# Patient Record
Sex: Male | Born: 1985 | Race: Black or African American | Hispanic: No | Marital: Married | Smoking: Light tobacco smoker
Health system: Southern US, Community
[De-identification: ages and names within clinical notes are randomized; demographics above are authoritative.]

## PROBLEM LIST (undated history)

## (undated) HISTORY — PX: OTHER SURGICAL HISTORY: SHX169

---

## 2004-08-01 ENCOUNTER — Emergency Department: Payer: Self-pay | Admitting: Internal Medicine

## 2006-08-28 ENCOUNTER — Emergency Department: Payer: Self-pay | Admitting: Emergency Medicine

## 2007-06-08 ENCOUNTER — Emergency Department: Payer: Self-pay | Admitting: Emergency Medicine
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
## 2008-06-14 ENCOUNTER — Ambulatory Visit: Payer: Self-pay | Admitting: Internal Medicine

## 2009-05-18 ENCOUNTER — Ambulatory Visit: Payer: Self-pay | Admitting: Internal Medicine

## 2012-12-25 ENCOUNTER — Emergency Department: Payer: Self-pay | Admitting: Emergency Medicine
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
## 2015-02-25 ENCOUNTER — Ambulatory Visit (INDEPENDENT_AMBULATORY_CARE_PROVIDER_SITE_OTHER): Payer: BLUE CROSS/BLUE SHIELD

## 2015-02-25 ENCOUNTER — Encounter: Payer: Self-pay | Admitting: *Deleted

## 2015-02-25 ENCOUNTER — Ambulatory Visit
Admission: EM | Admit: 2015-02-25 | Discharge: 2015-02-25 | Disposition: A | Discharge status: Home / Self Care | Payer: BLUE CROSS/BLUE SHIELD | Attending: Family Medicine | Admitting: Family Medicine

## 2015-02-25 DIAGNOSIS — S46912A Strain of unspecified muscle, fascia and tendon at shoulder and upper arm level, left arm, initial encounter: Secondary | ICD-10-CM

## 2015-02-25 MED ORDER — IBUPROFEN 800 MG PO TABS: 800.0000 mg | ORAL_TABLET | Freq: Three times a day (TID) | ORAL | Status: DC

## 2015-02-25 MED ORDER — ACETAMINOPHEN 500 MG PO TABS: 1000.0000 mg | ORAL_TABLET | Freq: Four times a day (QID) | ORAL | Status: AC | PRN

## 2015-02-25 NOTE — Discharge Instructions (Signed)
Cryotherapy Cryotherapy means treatment with cold. Ice or gel packs can be used to reduce both pain and swelling. Ice is the most helpful within the first 24 to 48 hours after an injury or flare-up from overusing a muscle or joint. Sprains, strains, spasms, burning pain, shooting pain, and aches can all be eased with ice. Ice can also be used when recovering from surgery. Ice is effective, has very few side effects, and is safe for most people to use. PRECAUTIONS  Ice is not a safe treatment option for people with: 1. Raynaud phenomenon. This is a condition affecting small blood vessels in the extremities. Exposure to cold may cause your problems to return. 2. Cold hypersensitivity. There are many forms of cold hypersensitivity, including: 1. Cold urticaria. Red, itchy hives appear on the skin when the tissues begin to warm after being iced. 2. Cold erythema. This is a red, itchy rash caused by exposure to cold. 3. Cold hemoglobinuria. Red blood cells break down when the tissues begin to warm after being iced. The hemoglobin that carry oxygen are passed into the urine because they cannot combine with blood proteins fast enough. 3. Numbness or altered sensitivity in the area being iced. If you have any of the following conditions, do not use ice until you have discussed cryotherapy with your caregiver: 1. Heart conditions, such as arrhythmia, angina, or chronic heart disease. 2. High blood pressure. 3. Healing wounds or open skin in the area being iced. 4. Current infections. 5. Rheumatoid arthritis. 6. Poor circulation. 7. Diabetes. Ice slows the blood flow in the region it is applied. This is beneficial when trying to stop inflamed tissues from spreading irritating chemicals to surrounding tissues. However, if you expose your skin to cold temperatures for too long or without the proper protection, you can damage your skin or nerves. Watch for signs of skin damage due to cold. HOME CARE  INSTRUCTIONS Follow these tips to use ice and cold packs safely. 1. Place a dry or damp towel between the ice and skin. A damp towel will cool the skin more quickly, so you may need to shorten the time that the ice is used. 2. For a more rapid response, add gentle compression to the ice. 3. Ice for no more than 10 to 20 minutes at a time. The bonier the area you are icing, the less time it will take to get the benefits of ice. 4. Check your skin after 5 minutes to make sure there are no signs of a poor response to cold or skin damage. 5. Rest 20 minutes or more between uses. 6. Once your skin is numb, you can end your treatment. You can test numbness by very lightly touching your skin. The touch should be so light that you do not see the skin dimple from the pressure of your fingertip. When using ice, most people will feel these normal sensations in this order: cold, burning, aching, and numbness. 7. Do not use ice on someone who cannot communicate their responses to pain, such as small children or people with dementia. HOW TO MAKE AN ICE PACK Ice packs are the most common way to use ice therapy. Other methods include ice massage, ice baths, and cryosprays. Muscle creams that cause a cold, tingly feeling do not offer the same benefits that ice offers and should not be used as a substitute unless recommended by your caregiver. To make an ice pack, do one of the following: 1. Place crushed ice or a  bag of frozen vegetables in a sealable plastic bag. Squeeze out the excess air. Place this bag inside another plastic bag. Slide the bag into a pillowcase or place a damp towel between your skin and the bag. 2. Mix 3 parts water with 1 part rubbing alcohol. Freeze the mixture in a sealable plastic bag. When you remove the mixture from the freezer, it will be slushy. Squeeze out the excess air. Place this bag inside another plastic bag. Slide the bag into a pillowcase or place a damp towel between your skin and  the bag. SEEK MEDICAL CARE IF: 1. You develop white spots on your skin. This may give the skin a blotchy (mottled) appearance. 2. Your skin turns blue or pale. 3. Your skin becomes waxy or hard. 4. Your swelling gets worse. MAKE SURE YOU:  1. Understand these instructions. 2. Will watch your condition. 3. Will get help right away if you are not doing well or get worse.   This information is not intended to replace advice given to you by your health care provider. Make sure you discuss any questions you have with your health care provider.   Document Released: 11/25/2010 Document Revised: 04/21/2014 Document Reviewed: 11/25/2010 Elsevier Interactive Patient Education 2016 [employer]  Muscle Strain A muscle strain is an injury that occurs when a muscle is stretched beyond its normal length. Usually a small number of muscle fibers are torn when this happens. Muscle strain is rated in degrees. First-degree strains have the least amount of muscle fiber tearing and pain. Second-degree and third-degree strains have increasingly more tearing and pain.  Usually, recovery from muscle strain takes 1-2 weeks. Complete healing takes 5-6 weeks.  CAUSES  Muscle strain happens when a sudden, violent force placed on a muscle stretches it too far. This may occur with lifting, sports, or a fall.  RISK FACTORS Muscle strain is especially common in athletes.  SIGNS AND SYMPTOMS At the site of the muscle strain, there may be: 4. Pain. 5. Bruising. 6. Swelling. 7. Difficulty using the muscle due to pain or lack of normal function. DIAGNOSIS  Your health care provider will perform a physical exam and ask about your medical history. TREATMENT  Often, the best treatment for a muscle strain is resting, icing, and applying cold compresses to the injured area.  HOME CARE INSTRUCTIONS  8. Use the PRICE method of treatment to promote muscle healing during the first 2-3 days after your injury. The PRICE  method involves:  Protecting the muscle from being injured again.  Restricting your activity and resting the injured body part.  Icing your injury. To do this, put ice in a plastic bag. Place a towel between your skin and the bag. Then, apply the ice and leave it on from 15-20 minutes each hour. After the third day, switch to moist heat packs.  Apply compression to the injured area with a splint or elastic bandage. Be careful not to wrap it too tightly. This may interfere with blood circulation or increase swelling.  Elevate the injured body part above the level of your heart as often as you can. 9. Only take over-the-counter or prescription medicines for pain, discomfort, or fever as directed by your health care provider. 10. Warming up prior to exercise helps to prevent future muscle strains. SEEK MEDICAL CARE IF:  8. You have increasing pain or swelling in the injured area. 9. You have numbness, tingling, or a significant loss of strength in the injured area. MAKE SURE  YOU:  3. Understand these instructions. 4. Will watch your condition. 5. Will get help right away if you are not doing well or get worse.   This information is not intended to replace advice given to you by your health care provider. Make sure you discuss any questions you have with your health care provider.   Document Released: 03/31/2005 Document Revised: 01/19/2013 Document Reviewed: 10/28/2012 Elsevier Interactive Patient Education 2016 [employer] Shoulder Range of Motion Exercises Shoulder range of motion (ROM) exercises are designed to keep the shoulder moving freely. They are often recommended for people who have shoulder pain. MOVEMENT EXERCISE When you are able, do this exercise 5-6 days per week, or as told by your health care provider. Work toward doing 2 sets of 10 swings. Pendulum Exercise How To Do This Exercise Lying Down 8. Lie face-down on a bed with your abdomen close to the side of the  bed. 9. Let your arm hang over the side of the bed. 10. Relax your shoulder, arm, and hand. 11. Slowly and gently swing your arm forward and back. Do not use your neck muscles to swing your arm. They should be relaxed. If you are struggling to swing your arm, have someone gently swing it for you. When you do this exercise for the first time, swing your arm at a 15 degree angle for 15 seconds, or swing your arm 10 times. As pain lessens over time, increase the angle of the swing to 30-45 degrees. 12. Repeat steps 1-4 with the other arm. How To Do This Exercise While Standing 11. Stand next to a sturdy chair or table and hold on to it with your hand.  Bend forward at the waist.  Bend your knees slightly.  Relax your other arm and let it hang limp.  Relax the shoulder blade of the arm that is hanging and let it drop.  While keeping your shoulder relaxed, use body motion to swing your arm in small circles. The first time you do this exercise, swing your arm for about 30 seconds or 10 times. When you do it next time, swing your arm for a little longer.  Stand up tall and relax.  Repeat steps 1-7, this time changing the direction of the circles. 12. Repeat steps 1-8 with the other arm. STRETCHING EXERCISES Do these exercises 3-4 times per day on 5-6 days per week or as told by your health care provider. Work toward holding the stretch for 20 seconds. Stretching Exercise 1 10. Lift your arm straight out in front of you. 11. Bend your arm 90 degrees at the elbow (right angle) so your forearm goes across your body and looks like the letter "L." 12. Use your other arm to gently pull the elbow forward and across your body. 13. Repeat steps 1-3 with the other arm. Stretching Exercise 2 You will need a towel or rope for this exercise. 6. Bend one arm behind your back with the palm facing outward. 7. Hold a towel with your other hand. 8. Reach the arm that holds the towel above your head, and bend  that arm at the elbow. Your wrist should be behind your neck. 9. Use your free hand to grab the free end of the towel. 10. With the higher hand, gently pull the towel up behind you. 11. With the lower hand, pull the towel down behind you. 12. Repeat steps 1-6 with the other arm. STRENGTHENING EXERCISES Do each of these exercises at four different times  of day (sessions) every day or as told by your health care provider. To begin with, repeat each exercise 5 times (repetitions). Work toward doing 3 sets of 12 repetitions or as told by your health care provider. Strengthening Exercise 1 You will need a light weight for this activity. As you grow stronger, you may use a heavier weight. 5. Standing with a weight in your hand, lift your arm straight out to the side until it is at the same height as your shoulder. 6. Bend your arm at 90 degrees so that your fingers are pointing to the ceiling. 7. Slowly raise your hand until your arm is straight up in the air. 8. Repeat steps 1-3 with the other arm. Strengthening Exercise 2 You will need a light weight for this activity. As you grow stronger, you may use a heavier weight. 4. Standing with a weight in your hand, gradually move your straight arm in an arc, starting at your side, then out in front of you, then straight up over your head. 5. Gradually move your other arm in an arc, starting at your side, then out in front of you, then straight up over your head. 6. Repeat steps 1-2 with the other arm. Strengthening Exercise 3 You will need an elastic band for this activity. As you grow stronger, gradually increase the size of the bands or increase the number of bands that you use at one time. 1. While standing, hold an elastic band in one hand and raise that arm up in the air. 2. With your other hand, pull down the band until that hand is by your side. 3. Repeat steps 1-2 with the other arm.   This information is not intended to replace advice given to  you by your health care provider. Make sure you discuss any questions you have with your health care provider.   Document Released: 12/28/2002 Document Revised: 08/15/2014 Document Reviewed: 03/27/2014 Elsevier Interactive Patient Education 2016 [employer] Contusion A contusion is a deep bruise. Contusions are the result of a blunt injury to tissues and muscle fibers under the skin. The injury causes bleeding under the skin. The skin overlying the contusion may turn blue, purple, or yellow. Minor injuries will give you a painless contusion, but more severe contusions may stay painful and swollen for a few weeks.  CAUSES  This condition is usually caused by a blow, trauma, or direct force to an area of the body. SYMPTOMS  Symptoms of this condition include: 13. Swelling of the injured area. 14. Pain and tenderness in the injured area. 15. Discoloration. The area may have redness and then turn blue, purple, or yellow. DIAGNOSIS  This condition is diagnosed based on a physical exam and medical history. An X-ray, CT scan, or MRI may be needed to determine if there are any associated injuries, such as broken bones (fractures). TREATMENT  Specific treatment for this condition depends on what area of the body was injured. In general, the best treatment for a contusion is resting, icing, applying pressure to (compression), and elevating the injured area. This is often called the RICE strategy. Over-the-counter anti-inflammatory medicines may also be recommended for pain control.  HOME CARE INSTRUCTIONS  13. Rest the injured area. 14. If directed, apply ice to the injured area:  Put ice in a plastic bag.  Place a towel between your skin and the bag.  Leave the ice on for 20 minutes, 2-3 times per day. 15. If directed, apply light compression to  the injured area using an elastic bandage. Make sure the bandage is not wrapped too tightly. Remove and reapply the bandage as directed by your health  care provider. 16. If possible, raise (elevate) the injured area above the level of your heart while you are sitting or lying down. 17. Take over-the-counter and prescription medicines only as told by your health care provider. SEEK MEDICAL CARE IF: 14. Your symptoms do not improve after several days of treatment. 15. Your symptoms get worse. 16. You have difficulty moving the injured area. SEEK IMMEDIATE MEDICAL CARE IF:  13. You have severe pain. 14. You have numbness in a hand or foot. 15. Your hand or foot turns pale or cold.   This information is not intended to replace advice given to you by your health care provider. Make sure you discuss any questions you have with your health care provider.   Document Released: 01/08/2005 Document Revised: 12/20/2014 Document Reviewed: 08/16/2014 Elsevier Interactive Patient Education 2016 [employer] Shoulder Sprain A shoulder sprain is a partial or complete tear in one of the tough, fiber-like tissues (ligaments) in the shoulder. The ligaments in the shoulder help to hold the shoulder in place. CAUSES This condition may be caused by: 16. A fall. 17. A hit to the shoulder. 18. A twist of the arm. RISK FACTORS This condition is more likely to develop in: 57. People who play sports. 19. People who have problems with balance or coordination. SYMPTOMS Symptoms of this condition include: 17. Pain when moving the shoulder. 18. Limited ability to move the shoulder. 19. Swelling and tenderness on top of the shoulder. 20. Warmth in the shoulder. 21. A change in the shape of the shoulder. 22. Redness or bruising on the shoulder. DIAGNOSIS This condition is diagnosed with a physical exam. During the exam, you may be asked to do simple exercises with your shoulder. You may also have imaging tests, such as X-rays, MRI, or a CT scan. These tests can show how severe the sprain is. TREATMENT This condition may be treated with: 16. Rest. 17. Pain  medicine. 18. Ice. 19. A sling or brace. This is used to keep the arm still while the shoulder is healing. 20. Physical therapy or rehabilitation exercises. These help to improve the range of motion and strength of the shoulder. 21. Surgery (rare). Surgery may be needed if the sprain caused a joint to become unstable. Surgery may also be needed to reduce pain. Some people may develop ongoing shoulder pain or lose some range of motion in the shoulder. However, most people do not develop long-term problems. HOME CARE INSTRUCTIONS 9. Rest. 10. Take over-the-counter and prescription medicines only as told by your health care provider. 11. If directed, apply ice to the area: 1. Put ice in a plastic bag. 2. Place a towel between your skin and the bag. 3. Leave the ice on for 20 minutes, 2-3 times per day. 12. If you were given a shoulder sling or brace: 1. Wear it as told. 2. Remove it to shower or bathe. 3. Move your arm only as much as told by your health care provider, but keep your hand moving to prevent swelling. 13. If you were shown how to do any exercises, do them as told by your health care provider. 14. Keep all follow-up visits as told by your health care provider. This is important. SEEK MEDICAL CARE IF: 7. Your pain gets worse. 8. Your pain is not relieved with medicines. 9. You have increased  redness or swelling. SEEK IMMEDIATE MEDICAL CARE IF: 4. You have a fever. 5. You cannot move your arm or shoulder. 6. You develop numbness or tingling in your arms, hands, or fingers.   This information is not intended to replace advice given to you by your health care provider. Make sure you discuss any questions you have with your health care provider.   Document Released: 08/17/2008 Document Revised: 12/20/2014 Document Reviewed: 07/24/2014 Else[employer] Interactive Patient Education [employer]

## 2015-02-25 NOTE — ED Provider Notes (Signed)
CSN: [id number]     Arrival date & time 02/25/15  1446 History   First MD Initiated Contact with Patient 02/25/15 1543     Chief Complaint  Patient presents with  . Shoulder Pain   (Consider location/radiation/quality/duration/timing/severity/associated sxs/prior Treatment) HPI Comments: Married african [place] male was playing football today on grass and another player landed on top of him.  Having pain mid trapezius left worsens with movement of left arm more than 1 foot from his side/putting on seat belt.  Applied ice, hasn't taken any medications.  Works as Naval architect for [employer].  Denied previous injury to left shoulder  Right hand dominant.  PSHx: ACL/MCL repair  PMHx denied  NKDA  The history is provided by the patient.    History reviewed. No pertinent past medical history. Past Surgical History  Procedure Laterality Date  . Acl mcl left knee Left    Family History  Problem Relation Age of Onset  . Hypertension Father    Social History  Substance Use Topics  . Smoking status: Never Smoker   . Smokeless tobacco: None  . Alcohol Use: Yes    Review of Systems  Constitutional: Negative for fever and chills.  HENT: Negative for congestion, ear pain and sore throat.   Eyes: Negative for pain and discharge.  Respiratory: Negative for cough and wheezing.   Cardiovascular: Negative for chest pain and leg swelling.  Gastrointestinal: Negative for nausea, vomiting, diarrhea, constipation and blood in stool.  Genitourinary: Negative for dysuria, hematuria and difficulty urinating.  Musculoskeletal: Positive for myalgias. Negative for back pain, joint swelling, arthralgias, gait problem, neck pain and neck stiffness.  Skin: Negative for rash.  Allergic/Immunologic: Negative for environmental allergies and food allergies.  Neurological: Negative for headaches.  Hematological: Negative for adenopathy. Does not bruise/bleed easily.  Psychiatric/Behavioral: Negative for  confusion and agitation. The patient is not nervous/anxious.     Allergies  Review of patient's allergies indicates no known allergies.  Home Medications   Prior to Admission medications   Medication Sig Start Date End Date Taking? Authorizing Provider  acetaminophen (TYLENOL) 500 MG tablet Take 2 tablets (1,000 mg total) by mouth every 6 (six) hours as needed for mild pain or moderate pain. 02/25/15 03/04/15  Barbaraann Barthel, NP  ibuprofen (ADVIL,MOTRIN) 800 MG tablet Take 1 tablet (800 mg total) by mouth 3 (three) times daily. 02/25/15   Barbaraann Barthel, NP   Meds Ordered and Administered this Visit  Medications - No data to display  BP 145/84 mmHg  Pulse 96  Temp(Src) 98.2 F (36.8 C) (Oral)  Resp 20  Ht  (2.032 m)  Wt 350 lb (158.759 kg)  BMI 38.45 kg/m2  SpO2 100% No data found.   Physical Exam  Constitutional: He is oriented to person, place, and time. Vital signs are normal. He appears well-developed and well-nourished. He is active and cooperative.  Non-toxic appearance. He does not have a sickly appearance. He does not appear ill. No distress.  HENT:  Head: Normocephalic and atraumatic.  Right Ear: Hearing, tympanic membrane, external ear and ear canal normal.  Left Ear: Hearing, tympanic membrane, external ear and ear canal normal.  Nose: Nose normal. No mucosal edema, rhinorrhea, nose lacerations, sinus tenderness, nasal deformity or septal deviation. No epistaxis. Right sinus exhibits no maxillary sinus tenderness and no frontal sinus tenderness. Left sinus exhibits no maxillary sinus tenderness and no frontal sinus tenderness.  Mouth/Throat: Uvula is midline, oropharynx is clear and moist and mucous  membranes are normal. Mucous membranes are not pale, not dry and not cyanotic. He does not have dentures. No oral lesions. No trismus in the jaw. Normal dentition. No dental abscesses, uvula swelling, lacerations or dental caries. No oropharyngeal exudate,  posterior oropharyngeal edema, posterior oropharyngeal erythema or tonsillar abscesses.  Eyes: Conjunctivae, EOM and lids are normal. Pupils are equal, round, and reactive to light. Right eye exhibits no chemosis, no discharge, no exudate and no hordeolum. No foreign body present in the right eye. Left eye exhibits no chemosis, no discharge, no exudate and no hordeolum. No foreign body present in the left eye. Right conjunctiva is not injected. Right conjunctiva has no hemorrhage. Left conjunctiva is not injected. Left conjunctiva has no hemorrhage. No scleral icterus. Right eye exhibits normal extraocular motion and no nystagmus. Left eye exhibits normal extraocular motion and no nystagmus. Right pupil is round and reactive. Left pupil is round and reactive. Pupils are equal.  Neck: Trachea normal, normal range of motion and phonation normal. Neck supple. No tracheal tenderness, no spinous process tenderness and no muscular tenderness present. No rigidity. No tracheal deviation, no edema, no erythema and normal range of motion present. No thyroid mass and no thyromegaly present.  Cardiovascular: Normal rate, regular rhythm, normal heart sounds and intact distal pulses.  Exam reveals no gallop and no friction rub.   No murmur heard. Pulses:      Radial pulses are 2+ on the right side, and 2+ on the left side.  Pulmonary/Chest: Effort normal and breath sounds normal. No accessory muscle usage or stridor. No respiratory distress. He has no decreased breath sounds. He has no wheezes. He has no rhonchi. He has no rales. He exhibits no tenderness.  Abdominal: Soft. He exhibits no distension.  Musculoskeletal: He exhibits edema and tenderness.       Right shoulder: Normal.       Left shoulder: He exhibits decreased range of motion, swelling, pain and spasm. He exhibits no tenderness, no bony tenderness, no effusion, no crepitus, no deformity, no laceration, normal pulse and normal strength.       Right elbow:  Normal.      Left elbow: Normal.       Right wrist: Normal.       Left wrist: Normal.       Right hip: Normal.       Left hip: Normal.       Right knee: Normal.       Left knee: Normal.       Right ankle: Normal.       Left ankle: Normal.       Cervical back: Normal.       Thoracic back: Normal.       Lumbar back: Normal.       Right upper arm: Normal.       Left upper arm: Normal.       Right forearm: Normal.       Left forearm: Normal.       Arms:      Right hand: Normal.       Left hand: Normal.  Lymphadenopathy:       Head (right side): No submental, no submandibular, no tonsillar, no preauricular, no posterior auricular and no occipital adenopathy present.       Head (left side): No submental, no submandibular, no tonsillar, no preauricular, no posterior auricular and no occipital adenopathy present.    He has no cervical adenopathy.  Right cervical: No superficial cervical, no deep cervical and no posterior cervical adenopathy present.      Left cervical: No superficial cervical, no deep cervical and no posterior cervical adenopathy present.  Neurological: He is alert and oriented to person, place, and time. He is not disoriented. He displays no atrophy, no tremor and normal reflexes. No cranial nerve deficit or sensory deficit. He exhibits normal muscle tone. He displays no seizure activity. Coordination and gait normal. GCS eye subscore is 4. GCS verbal subscore is 5. GCS motor subscore is 6.  Reflex Scores:      Brachioradialis reflexes are 2+ on the right side and 2+ on the left side. Bilateral hand grasp equal 5/5  Skin: Skin is warm, dry and intact. No abrasion, no bruising, no burn, no ecchymosis, no laceration, no lesion, no petechiae and no rash noted. He is not diaphoretic. No cyanosis or erythema. No pallor. Nails show no clubbing.  Psychiatric: He has a normal mood and affect. His speech is normal and behavior is normal. Judgment and thought content normal.  Cognition and memory are normal.    ED Course  Procedures (including critical care time)  Labs Review Labs Reviewed - No data to display  Imaging Review Dg Shoulder Left  02/25/2015  CLINICAL DATA:  Injury left shoulder playing football today. Pain. Initial encounter. EXAM: LEFT SHOULDER - 2+ VIEW COMPARISON:  None. FINDINGS: There is no evidence of fracture or dislocation. There is no evidence of arthropathy or other focal bone abnormality. Soft tissues are unremarkable. IMPRESSION: Negative exam. Electronically Signed   By: Drusilla Kannerhomas  Dalessio M.D.   On: 02/25/2015 16:18    1638 Discussed negative xray results for fracture or dislocation with patient.  Given copy of radiology report  Patient verbalized understanding of information and had no further questions at this time.   MDM   1. Shoulder strain, left, initial encounter    Work excuse return to work 16 Nov.  Avoid lifting greater than 5 lbs with left arm x 7 days.  Exitcare handout on shoulder strain with rehab exercises given to patient.  Patient given work/school/sports note with restrictions.  Patient was instructed to rest, ice, and ROM exercises.  Activity as tolerated.  Patient is to take OTC po NSAIDS as needed tylenol 1000mg  po QID prn and/or motrin 800mg  po TID prn  Discussed with patient preferred he used tylenol first and if no relief add motrin due to hypertension.  Follow up if symptoms persist or worsen.  Typically strain heals over a couple weeks.  Discussed not to use sling as can worsen pain/result in frozen shoulder.  Gentle AROM exercises demonstrated arm circles, wall spiders.  Patient verbalized agreement and understanding of treatment plan.  P2:  Injury Prevention and Fitness.  Continue to monitor blood pressure at home and maintain log of blood pressure and pulse to bring to follow up appointments with PCM.  Establish care with PCM.  Discussed with patient pain could be cause of elevated blood pressure but if not  improving with improvement of shoulder may need to start on blood pressure medications.  Continue low sodium diet and exercise program.  Recommended weight loss/weight maintenance to BMI 20-25 current BMI 38.5.  Return to the clinic if any new symptoms.  Patient verbalized agreement and understanding of treatment plan and had no further questions at this time.   P2:  Diet and Exercise specific for HTN    Barbaraann Barthelina A Betancourt, NP 02/26/15 1720

## 2015-02-25 NOTE — ED Notes (Signed)
Playing football in backyard, fell and another player fell on top of him.  Able to move left shoulder behind back but unable to lift and go straight out.  Xrays pending.

## 2019-01-13 ENCOUNTER — Other Ambulatory Visit: Payer: Self-pay | Admitting: Lab

## 2019-01-13 ENCOUNTER — Encounter: Payer: Self-pay | Admitting: Family Medicine

## 2019-01-13 ENCOUNTER — Ambulatory Visit (INDEPENDENT_AMBULATORY_CARE_PROVIDER_SITE_OTHER): Payer: BC Managed Care – PPO | Admitting: Family Medicine

## 2019-01-13 ENCOUNTER — Other Ambulatory Visit: Payer: Self-pay

## 2019-01-13 VITALS — BP 142/98 | HR 96 | Temp 97.0°F | Resp 16 | Ht 78.0 in | Wt >= 6400 oz

## 2019-01-13 DIAGNOSIS — I10 Essential (primary) hypertension: Secondary | ICD-10-CM | POA: Insufficient documentation

## 2019-01-13 DIAGNOSIS — R358 Other polyuria: Secondary | ICD-10-CM

## 2019-01-13 DIAGNOSIS — R3589 Other polyuria: Secondary | ICD-10-CM

## 2019-01-13 DIAGNOSIS — R7303 Prediabetes: Secondary | ICD-10-CM | POA: Diagnosis not present

## 2019-01-13 LAB — POCT GLYCOSYLATED HEMOGLOBIN (HGB A1C)
HbA1c POC (<> result, manual entry): 6.1 % (ref 4.0–5.6)
HbA1c, POC (controlled diabetic range): 6.1 % (ref 0.0–7.0)
HbA1c, POC (prediabetic range): 6.1 % (ref 5.7–6.4)
Hemoglobin A1C: 6.1 % — AB (ref 4.0–5.6)

## 2019-01-13 MED ORDER — HYDROCHLOROTHIAZIDE 12.5 MG PO TABS: 12.5000 mg | ORAL_TABLET | Freq: Every day | ORAL | 0 refills | Status: DC

## 2019-01-13 NOTE — Progress Notes (Signed)
Subjective:    Patient ID: Jordan Sims, male    DOB: 1986-01-11, 33 y.o.   MRN: [id number]  HPI  Patient presents to clinic to establish with PCP  Patient states for the last 2 years he has been noticing his blood pressure been more elevated.  Is a family history of hypertension in his dad.  Also has gained weight, so became concerned that high blood pressure combination with the weight gain put him at risk someone to come and get evaluated.  Past medical, social, family and surgical history were reviewed and updated in chart.  Is occasionally smokes cigarettes but not every day.  Does drink alcohol on occasion, usually holidays or some social events.  Patient is a Administrator.  Drives mainly from here to [place].  He used to be a Radiographer, therapeutic, but prefers being able to be home every night with his wife.  In addition to elevated BP, and notes that he seems to be urinating a little bit more than he has in the past.  There are no active problems to display for this patient.  Social History   Tobacco Use  . Smoking status: Never Smoker  Substance Use Topics  . Alcohol use: Yes    Past Surgical History:  Procedure Laterality Date  . acl mcl left knee Left     Family History  Problem Relation Age of Onset  . Hypertension Father     Review of Systems  Constitutional: Negative for chills, fatigue and fever.  HENT: Negative for congestion, ear pain, sinus pain and sore throat.   Eyes: Negative.   Respiratory: Negative for cough, shortness of breath and wheezing.   Cardiovascular: Negative for chest pain, palpitations and leg swelling.  Gastrointestinal: Negative for abdominal pain, diarrhea, nausea and vomiting.  Genitourinary: Negative for dysuria, and urgency. +increased frequency Musculoskeletal: Negative for arthralgias and myalgias.  Skin: Negative for color change, pallor and rash.  Neurological: Negative for syncope,  light-headedness and headaches.  Psychiatric/Behavioral: The patient is not nervous/anxious.       Objective:   Physical Exam Vitals signs and nursing note reviewed.  Constitutional:      General: He is not in acute distress.    Appearance: He is obese. He is not ill-appearing, toxic-appearing or diaphoretic.  HENT:     Head: Normocephalic and atraumatic.     Right Ear: Tympanic membrane, ear canal and external ear normal.     Left Ear: Tympanic membrane, ear canal and external ear normal.  Eyes:     General: No scleral icterus.    Extraocular Movements: Extraocular movements intact.     Conjunctiva/sclera: Conjunctivae normal.     Pupils: Pupils are equal, round, and reactive to light.  Neck:     Musculoskeletal: Normal range of motion and neck supple. No neck rigidity or muscular tenderness.     Vascular: No carotid bruit.  Cardiovascular:     Rate and Rhythm: Normal rate and regular rhythm.  Pulmonary:     Effort: Pulmonary effort is normal. No respiratory distress.     Breath sounds: Normal breath sounds.  Musculoskeletal:     Right lower leg: No edema.     Left lower leg: No edema.  Skin:    General: Skin is warm and dry.  Neurological:     Mental Status: He is alert and oriented to person, place, and time.     Gait: Gait normal.  Psychiatric:  Mood and Affect: Mood normal.        Behavior: Behavior normal.    Today's Vitals   01/13/19 1418  BP: (!) 142/98  Pulse: 96  Resp: 16  Temp: (!) 97 F (36.1 C)  SpO2: 95%  Weight: (!) 415 lb 9.6 oz (188.5 kg)  Height: 6\' 6"  (1.981 m)   Body mass index is 48.03 kg/m.      Assessment & Plan:    Hypertension-patient will start low-dose hydrochlorothiazide to see if this brings blood pressure back in good range.  Also given handout outlining Dash type diet to help watch salt intake, eat a diet full of lean proteins lots of vegetables and keep up good water intake.  Goal of this type of diet would be to improve  blood pressure and promote weight loss.  Polyuria/prediabetes-patient's A1c is 6.1%.  Advised patient that puts him in prediabetic range.  Advised patient we need to get diet and weight under better control in hopes of preventing him from becoming diabetic.  Patient will follow-up here in about 2 weeks for recheck on blood pressure and we will do full blood panel at that time so we can be sure electrolytes are remaining stable on hydrochlorothiazide.

## 2019-01-13 NOTE — Patient Instructions (Signed)
DASH Eating Plan DASH stands for "Dietary Approaches to Stop Hypertension." The DASH eating plan is a healthy eating plan that has been shown to reduce high blood pressure (hypertension). It may also reduce your risk for type 2 diabetes, heart disease, and stroke. The DASH eating plan may also help with weight loss. What are tips for following this plan?  General guidelines  Avoid eating more than 2,300 mg (milligrams) of salt (sodium) a day. If you have hypertension, you may need to reduce your sodium intake to 1,500 mg a day.  Limit alcohol intake to no more than 1 drink a day for nonpregnant women and 2 drinks a day for men. One drink equals 12 oz of beer, 5 oz of wine, or 1 oz of hard liquor.  Work with your health care provider to maintain a healthy body weight or to lose weight. Ask what an ideal weight is for you.  Get at least 30 minutes of exercise that causes your heart to beat faster (aerobic exercise) most days of the week. Activities may include walking, swimming, or biking.  Work with your health care provider or diet and nutrition specialist (dietitian) to adjust your eating plan to your individual calorie needs. Reading food labels   Check food labels for the amount of sodium per serving. Choose foods with less than 5 percent of the Daily Value of sodium. Generally, foods with less than 300 mg of sodium per serving fit into this eating plan.  To find whole grains, look for the word "whole" as the first word in the ingredient list. Shopping  Buy products labeled as "low-sodium" or "no salt added."  Buy fresh foods. Avoid canned foods and premade or frozen meals. Cooking  Avoid adding salt when cooking. Use salt-free seasonings or herbs instead of table salt or sea salt. Check with your health care provider or pharmacist before using salt substitutes.  Do not fry foods. Cook foods using healthy methods such as baking, boiling, grilling, and broiling instead.  Cook with  heart-healthy oils, such as olive, canola, soybean, or sunflower oil. Meal planning  Eat a balanced diet that includes: ? 5 or more servings of fruits and vegetables each day. At each meal, try to fill half of your plate with fruits and vegetables. ? Up to 6-8 servings of whole grains each day. ? Less than 6 oz of lean meat, poultry, or fish each day. A 3-oz serving of meat is about the same size as a deck of cards. One egg equals 1 oz. ? 2 servings of low-fat dairy each day. ? A serving of nuts, seeds, or beans 5 times each week. ? Heart-healthy fats. Healthy fats called Omega-3 fatty acids are found in foods such as flaxseeds and coldwater fish, like sardines, salmon, and mackerel.  Limit how much you eat of the following: ? Canned or prepackaged foods. ? Food that is high in trans fat, such as fried foods. ? Food that is high in saturated fat, such as fatty meat. ? Sweets, desserts, sugary drinks, and other foods with added sugar. ? Full-fat dairy products.  Do not salt foods before eating.  Try to eat at least 2 vegetarian meals each week.  Eat more home-cooked food and less restaurant, buffet, and fast food.  When eating at a restaurant, ask that your food be prepared with less salt or no salt, if possible. What foods are recommended? The items listed may not be a complete list. Talk with your dietitian about   what dietary choices are best for you. Grains Whole-grain or whole-wheat bread. Whole-grain or whole-wheat pasta. Brown rice. Oatmeal. Quinoa. Bulgur. Whole-grain and low-sodium cereals. Pita bread. Low-fat, low-sodium crackers. Whole-wheat flour tortillas. Vegetables Fresh or frozen vegetables (raw, steamed, roasted, or grilled). Low-sodium or reduced-sodium tomato and vegetable juice. Low-sodium or reduced-sodium tomato sauce and tomato paste. Low-sodium or reduced-sodium canned vegetables. Fruits All fresh, dried, or frozen fruit. Canned fruit in natural juice (without  added sugar). Meat and other protein foods Skinless chicken or turkey. Ground chicken or turkey. Pork with fat trimmed off. Fish and seafood. Egg whites. Dried beans, peas, or lentils. Unsalted nuts, nut butters, and seeds. Unsalted canned beans. Lean cuts of beef with fat trimmed off. Low-sodium, lean deli meat. Dairy Low-fat (1%) or fat-free (skim) milk. Fat-free, low-fat, or reduced-fat cheeses. Nonfat, low-sodium ricotta or cottage cheese. Low-fat or nonfat yogurt. Low-fat, low-sodium cheese. Fats and oils Soft margarine without trans fats. Vegetable oil. Low-fat, reduced-fat, or light mayonnaise and salad dressings (reduced-sodium). Canola, safflower, olive, soybean, and sunflower oils. Avocado. Seasoning and other foods Herbs. Spices. Seasoning mixes without salt. Unsalted popcorn and pretzels. Fat-free sweets. What foods are not recommended? The items listed may not be a complete list. Talk with your dietitian about what dietary choices are best for you. Grains Baked goods made with fat, such as croissants, muffins, or some breads. Dry pasta or rice meal packs. Vegetables Creamed or fried vegetables. Vegetables in a cheese sauce. Regular canned vegetables (not low-sodium or reduced-sodium). Regular canned tomato sauce and paste (not low-sodium or reduced-sodium). Regular tomato and vegetable juice (not low-sodium or reduced-sodium). Pickles. Olives. Fruits Canned fruit in a light or heavy syrup. Fried fruit. Fruit in cream or butter sauce. Meat and other protein foods Fatty cuts of meat. Ribs. Fried meat. Bacon. Sausage. Bologna and other processed lunch meats. Salami. Fatback. Hotdogs. Bratwurst. Salted nuts and seeds. Canned beans with added salt. Canned or smoked fish. Whole eggs or egg yolks. Chicken or turkey with skin. Dairy Whole or 2% milk, cream, and half-and-half. Whole or full-fat cream cheese. Whole-fat or sweetened yogurt. Full-fat cheese. Nondairy creamers. Whipped toppings.  Processed cheese and cheese spreads. Fats and oils Butter. Stick margarine. Lard. Shortening. Ghee. Bacon fat. Tropical oils, such as coconut, palm kernel, or palm oil. Seasoning and other foods Salted popcorn and pretzels. Onion salt, garlic salt, seasoned salt, table salt, and sea salt. Worcestershire sauce. Tartar sauce. Barbecue sauce. Teriyaki sauce. Soy sauce, including reduced-sodium. Steak sauce. Canned and packaged gravies. Fish sauce. Oyster sauce. Cocktail sauce. Horseradish that you find on the shelf. Ketchup. Mustard. Meat flavorings and tenderizers. Bouillon cubes. Hot sauce and Tabasco sauce. Premade or packaged marinades. Premade or packaged taco seasonings. Relishes. Regular salad dressings. Where to find more information:  National Heart, Lung, and Blood Institute: www.nhlbi.nih.gov  American Heart Association: www.heart.org Summary  The DASH eating plan is a healthy eating plan that has been shown to reduce high blood pressure (hypertension). It may also reduce your risk for type 2 diabetes, heart disease, and stroke.  With the DASH eating plan, you should limit salt (sodium) intake to 2,300 mg a day. If you have hypertension, you may need to reduce your sodium intake to 1,500 mg a day.  When on the DASH eating plan, aim to eat more fresh fruits and vegetables, whole grains, lean proteins, low-fat dairy, and heart-healthy fats.  Work with your health care provider or diet and nutrition specialist (dietitian) to adjust your eating plan to your   individual calorie needs. This information is not intended to replace advice given to you by your health care provider. Make sure you discuss any questions you have with your health care provider. Document Released: 03/20/2011 Document Revised: 03/13/2017 Document Reviewed: 03/24/2016 Elsevier Patient Education  2020 [employer].  Prediabetes Eating Plan Prediabetes is a condition that causes blood sugar (glucose) levels to be higher  than normal. This increases the risk for developing diabetes. In order to prevent diabetes from developing, your health care provider may recommend a diet and other lifestyle changes to help you:  Control your blood glucose levels.  Improve your cholesterol levels.  Manage your blood pressure. Your health care provider may recommend working with a diet and nutrition specialist (dietitian) to make a meal plan that is best for you. What are tips for following this plan? Lifestyle  Set weight loss goals with the help of your health care team. It is recommended that most people with prediabetes lose 7% of their current body weight.  Exercise for at least 30 minutes at least 5 days a week.  Attend a support group or seek ongoing support from a mental health counselor.  Take over-the-counter and prescription medicines only as told by your health care provider. Reading food labels  Read food labels to check the amount of fat, salt (sodium), and sugar in prepackaged foods. Avoid foods that have: ? Saturated fats. ? Trans fats. ? Added sugars.  Avoid foods that have more than 300 milligrams (mg) of sodium per serving. Limit your daily sodium intake to less than 2,300 mg each day. Shopping  Avoid buying pre-made and processed foods. Cooking  Cook with olive oil. Do not use butter, lard, or ghee.  Bake, broil, grill, or boil foods. Avoid frying. Meal planning   Work with your dietitian to develop an eating plan that is right for you. This may include: ? Tracking how many calories you take in. Use a food diary, notebook, or mobile application to track what you eat at each meal. ? Using the glycemic index (GI) to plan your meals. The index tells you how quickly a food will raise your blood glucose. Choose low-GI foods. These foods take a longer time to raise blood glucose.  Consider following a Mediterranean diet. This diet includes: ? Several servings each day of fresh fruits and  vegetables. ? Eating fish at least twice a week. ? Several servings each day of whole grains, beans, nuts, and seeds. ? Using olive oil instead of other fats. ? Moderate alcohol consumption. ? Eating small amounts of red meat and whole-fat dairy.  If you have high blood pressure, you may need to limit your sodium intake or follow a diet such as the DASH eating plan. DASH is an eating plan that aims to lower high blood pressure. What foods are recommended? The items listed below may not be a complete list. Talk with your dietitian about what dietary choices are best for you. Grains Whole grains, such as whole-wheat or whole-grain breads, crackers, cereals, and pasta. Unsweetened oatmeal. Bulgur. Barley. Quinoa. Brown rice. Corn or whole-wheat flour tortillas or taco shells. Vegetables Lettuce. Spinach. Peas. Beets. Cauliflower. Cabbage. Broccoli. Carrots. Tomatoes. Squash. Eggplant. Herbs. Peppers. Onions. Cucumbers. Brussels sprouts. Fruits Berries. Bananas. Apples. Oranges. Grapes. Papaya. Mango. Pomegranate. Kiwi. Grapefruit. Cherries. Meats and other protein foods Seafood. Poultry without skin. Lean cuts of pork and beef. Tofu. Eggs. Nuts. Beans. Dairy Low-fat or fat-free dairy products, such as yogurt, cottage cheese, and cheese. Beverages  Water. Tea. Coffee. Sugar-free or diet soda. Seltzer water. Lowfat or no-fat milk. Milk alternatives, such as soy or almond milk. Fats and oils Olive oil. Canola oil. Sunflower oil. Grapeseed oil. Avocado. Walnuts. Sweets and desserts Sugar-free or low-fat pudding. Sugar-free or low-fat ice cream and other frozen treats. Seasoning and other foods Herbs. Sodium-free spices. Mustard. Relish. Low-fat, low-sugar ketchup. Low-fat, low-sugar barbecue sauce. Low-fat or fat-free mayonnaise. What foods are not recommended? The items listed below may not be a complete list. Talk with your dietitian about what dietary choices are best for you. Grains  Refined white flour and flour products, such as bread, pasta, snack foods, and cereals. Vegetables Canned vegetables. Frozen vegetables with butter or cream sauce. Fruits Fruits canned with syrup. Meats and other protein foods Fatty cuts of meat. Poultry with skin. Breaded or fried meat. Processed meats. Dairy Full-fat yogurt, cheese, or milk. Beverages Sweetened drinks, such as sweet iced tea and soda. Fats and oils Butter. Lard. Ghee. Sweets and desserts Baked goods, such as cake, cupcakes, pastries, cookies, and cheesecake. Seasoning and other foods Spice mixes with added salt. Ketchup. Barbecue sauce. Mayonnaise. Summary  To prevent diabetes from developing, you may need to make diet and other lifestyle changes to help control blood sugar, improve cholesterol levels, and manage your blood pressure.  Set weight loss goals with the help of your health care team. It is recommended that most people with prediabetes lose 7 percent of their current body weight.  Consider following a Mediterranean diet that includes plenty of fresh fruits and vegetables, whole grains, beans, nuts, seeds, fish, lean meat, low-fat dairy, and healthy oils. This information is not intended to replace advice given to you by your health care provider. Make sure you discuss any questions you have with your health care provider. Document Released: 08/15/2014 Document Revised: 07/23/2018 Document Reviewed: 06/04/2016 Elsevier Patient Education  2020 [employer].

## 2019-01-27 ENCOUNTER — Ambulatory Visit: Payer: BC Managed Care – PPO | Admitting: Family Medicine

## 2019-01-27 ENCOUNTER — Other Ambulatory Visit: Payer: Self-pay

## 2019-01-27 VITALS — BP 122/96 | HR 106 | Temp 96.0°F | Resp 18 | Ht 78.0 in | Wt >= 6400 oz

## 2019-01-27 DIAGNOSIS — I10 Essential (primary) hypertension: Secondary | ICD-10-CM | POA: Diagnosis not present

## 2019-01-27 DIAGNOSIS — Z6841 Body Mass Index (BMI) 40.0 and over, adult: Secondary | ICD-10-CM

## 2019-01-27 MED ORDER — AMLODIPINE BESYLATE 5 MG PO TABS: 5.0000 mg | ORAL_TABLET | Freq: Every day | ORAL | 1 refills | Status: DC

## 2019-01-27 MED ORDER — HYDROCHLOROTHIAZIDE 12.5 MG PO TABS: 12.5000 mg | ORAL_TABLET | Freq: Every day | ORAL | 1 refills | Status: DC

## 2019-01-27 NOTE — Progress Notes (Signed)
Subjective:    Patient ID: Jordan Sims, male    DOB: March 18, 1986, 33 y.o.   MRN: [id number]  HPI   Patient presents to clinic for follow-up of blood pressure after starting hydrochlorothiazide.  Tolerating this medicine with no problems.  Denies chest pain, palpitations, lower extremity swelling or shortness of breath.  Continues to smoke; but it is only occasionally.  Working on trying not to smoke at all  Patient Active Problem List   Diagnosis Date Noted  . Prediabetes 01/13/2019  . Essential hypertension 01/13/2019   Social History   Tobacco Use  . Smoking status: Light Tobacco Smoker  . Smokeless tobacco: Never Used  . Tobacco comment: Ocassionally  Substance Use Topics  . Alcohol use: Yes   Review of Systems  Constitutional: Negative for chills, fatigue and fever.  HENT: Negative for congestion, ear pain, sinus pain and sore throat.   Eyes: Negative.   Respiratory: Negative for cough, shortness of breath and wheezing.   Cardiovascular: Negative for chest pain, palpitations and leg swelling.  Gastrointestinal: Negative for abdominal pain, diarrhea, nausea and vomiting.  Genitourinary: Negative for dysuria, frequency and urgency.  Musculoskeletal: Negative for arthralgias and myalgias.  Skin: Negative for color change, pallor and rash.  Neurological: Negative for syncope, light-headedness and headaches.  Psychiatric/Behavioral: The patient is not nervous/anxious.       Objective:   Physical Exam Vitals signs and nursing note reviewed.  Constitutional:      General: He is not in acute distress.    Appearance: He is normal weight. He is not toxic-appearing.  HENT:     Head: Normocephalic and atraumatic.  Eyes:     General: No scleral icterus.    Extraocular Movements: Extraocular movements intact.     Conjunctiva/sclera: Conjunctivae normal.  Neck:     Musculoskeletal: Normal range of motion and neck supple. No neck rigidity.     Vascular: No carotid  bruit.  Cardiovascular:     Rate and Rhythm: Regular rhythm.     Heart sounds: Normal heart sounds. No murmur.  Pulmonary:     Effort: Pulmonary effort is normal. No respiratory distress.     Breath sounds: Normal breath sounds.  Musculoskeletal:     Right lower leg: No edema.     Left lower leg: No edema.  Skin:    General: Skin is warm and dry.  Neurological:     Mental Status: He is alert and oriented to person, place, and time.     Gait: Gait normal.  Psychiatric:        Mood and Affect: Mood normal.        Behavior: Behavior normal.       BP Readings from Last 3 Encounters:  01/27/19 (!) 122/96  01/13/19 (!) 142/98  02/25/15 (!) 145/84   Wt Readings from Last 3 Encounters:  01/27/19 (!) 413 lb 12.8 oz (187.7 kg)  01/13/19 (!) 415 lb 9.6 oz (188.5 kg)  02/25/15 (!) 350 lb (158.8 kg)   Today's Vitals   01/27/19 1501  BP: (!) 122/96  Pulse: (!) 106  Resp: 18  Temp: (!) 96 F (35.6 C)  TempSrc: Temporal  SpO2: 95%  Weight: (!) 413 lb 12.8 oz (187.7 kg)  Height: 6\' 6"  (1.981 m)   Body mass index is 47.82 kg/m.    Assessment & Plan:    Hypertension - blood pressure is improved from before, but systolic BP still above goal.  We will add on amlodipine 5  mg in addition to hydrochlorothiazide.  Morbid obesity - discussed healthy diet and regular physical activity with patient.  Discussed monitoring calories, and making good food choices that are more nutritious rather than relying fast food.  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  Patient will return to clinic in 2 weeks for nurse visit & labs. After nurse visit and if BP is in good range we will most likely plan for him to return to clinic in about 3 months.

## 2019-01-28 DIAGNOSIS — Z6841 Body Mass Index (BMI) 40.0 and over, adult: Secondary | ICD-10-CM | POA: Insufficient documentation

## 2019-02-09 ENCOUNTER — Other Ambulatory Visit: Payer: Self-pay

## 2019-02-10 ENCOUNTER — Other Ambulatory Visit: Payer: Self-pay

## 2019-02-10 ENCOUNTER — Ambulatory Visit (INDEPENDENT_AMBULATORY_CARE_PROVIDER_SITE_OTHER): Payer: BC Managed Care – PPO

## 2019-02-10 VITALS — BP 142/98 | HR 103

## 2019-02-10 DIAGNOSIS — I10 Essential (primary) hypertension: Secondary | ICD-10-CM

## 2019-02-10 NOTE — Progress Notes (Signed)
Patient presented today  for nurse visit BP check per order from 01/27/19.   Patient reports compliance with prescribed BP medications: YES  Last dose of BP medication: this morning at 4:00 am.  Patient denies having any symptoms today.  Patient said that being in the doctor's office affects his blood pressure.  1st bp reading in office today was 142/86.  2nd reading after 15 minutes of rest was 142/98.  BP Readings from Last 3 Encounters:  02/10/19 (!) 142/98  01/27/19 (!) 122/96  01/13/19 (!) 142/98   Pulse Readings from Last 3 Encounters:  02/10/19 (!) 103  01/27/19 (!) 106  01/13/19 [place], CMA

## 2019-02-14 MED ORDER — AMLODIPINE BESYLATE 10 MG PO TABS: 10.0000 mg | ORAL_TABLET | Freq: Every day | ORAL | 1 refills | Status: DC

## 2019-02-14 NOTE — Addendum Note (Signed)
Addended by: Philis Nettle on: 02/14/2019 12:01 PM   Modules accepted: Orders, Level of Service

## 2019-02-14 NOTE — Progress Notes (Signed)
Please have patient increase amlodipine to 10 mg daily.  He can take 2 of the 5 mg tablets together to make 10 mg dose to use up current supply  Continue HCTZ 12.5 mg per day  Philis Nettle FNP

## 2019-05-23 ENCOUNTER — Ambulatory Visit (INDEPENDENT_AMBULATORY_CARE_PROVIDER_SITE_OTHER): Payer: 59 | Admitting: Family

## 2019-05-23 ENCOUNTER — Other Ambulatory Visit: Payer: Self-pay

## 2019-05-23 ENCOUNTER — Encounter: Payer: Self-pay | Admitting: Family

## 2019-05-23 ENCOUNTER — Telehealth: Payer: Self-pay | Admitting: Family

## 2019-05-23 VITALS — Ht >= 80 in | Wt >= 6400 oz

## 2019-05-23 DIAGNOSIS — I10 Essential (primary) hypertension: Secondary | ICD-10-CM | POA: Diagnosis not present

## 2019-05-23 DIAGNOSIS — R7303 Prediabetes: Secondary | ICD-10-CM | POA: Diagnosis not present

## 2019-05-23 DIAGNOSIS — R0683 Snoring: Secondary | ICD-10-CM

## 2019-05-23 MED ORDER — AMLODIPINE BESYLATE 5 MG PO TABS: 5.0000 mg | ORAL_TABLET | Freq: Every day | ORAL | 1 refills | Status: DC

## 2019-05-23 NOTE — Progress Notes (Signed)
Virtual Visit via Video Note  I connected with@  on 05/23/19 at  4:00 PM EST by a video enabled telemedicine application and verified that I am speaking with the correct person using two identifiers.  Location patient: home Location provider:work  Persons participating in the virtual visit: patient, provider  I discussed the limitations of evaluation and management by telemedicine and the availability of in person appointments. The patient expressed understanding and agreed to proceed.   HPI: Concern for 'sleep apnea' Wife has seen him stop breathing, snores.   HTN- hasnt checked blood pressure.  Compliant with hctz. Has stopped taking amlodipine as didn't think he needed both medications. Worried that unable to sustain erection on hctz, Denies exertional chest pain or pressure, numbness or tingling radiating to left arm or jaw, palpitations, dizziness, frequent headaches, changes in vision, or shortness of breath.    ROS: See pertinent positives and negatives per HPI.  History reviewed. No pertinent past medical history.  Past Surgical History:  Procedure Laterality Date  . acl mcl left knee Left     Family History  Problem Relation Age of Onset  . Hypertension Father     SOCIAL HX: smoker   Current Outpatient Medications:  .  amLODipine (NORVASC) 5 MG tablet, Take 1 tablet (5 mg total) by mouth daily., Disp: 90 tablet, Rfl: 1  EXAM:  VITALS per patient if applicable:  GENERAL: alert, oriented, appears well and in no acute distress  HEENT: atraumatic, conjunttiva clear, no obvious abnormalities on inspection of external nose and ears  NECK: normal movements of the head and neck  LUNGS: on inspection no signs of respiratory distress, breathing rate appears normal, no obvious gross SOB, gasping or wheezing  CV: no obvious cyanosis  MS: moves all visible extremities without noticeable abnormality  PSYCH/NEURO: pleasant and cooperative, no obvious depression or  anxiety, speech and thought processing grossly intact  ASSESSMENT AND PLAN:  Discussed the following assessment and plan:  Essential hypertension - Plan: Ambulatory referral to Pulmonology, amLODipine (NORVASC) 5 MG tablet, CBC with Differential/Platelet, Comprehensive metabolic panel, Lipid panel, Hemoglobin A1c  Snores  Prediabetes Problem List Items Addressed This Visit      Cardiovascular and Mediastinum   Essential hypertension - Primary    BP Readings from Last 3 Encounters:  02/10/19 (!) 142/98  01/27/19 (!) 122/96  01/13/19 (!) 142/98   Unsure if at goal.  This patient was experiencing sexual dysfunction on hydrochlorothiazide alone, advised to stop hydrochlorothiazide, and to resume amlodipine at 5mg . Patient will purchase BP cuff and monitor as well.  Close f/u with BP check, labs.       Relevant Medications   amLODipine (NORVASC) 5 MG tablet   Other Relevant Orders   Ambulatory referral to Pulmonology   CBC with Differential/Platelet   Comprehensive metabolic panel   Lipid panel   Hemoglobin A1c     Other   Prediabetes    Pending a1c.       Snores    Symptoms of obstructive sleep apnea.  Referral to pulmonology for further evaluation         -we discussed possible serious and likely etiologies, options for evaluation and workup, limitations of telemedicine visit vs in person visit, treatment, treatment risks and precautions. Pt prefers to treat via telemedicine empirically rather then risking or undertaking an in person visit at this moment. Patient agrees to seek prompt in person care if worsening, new symptoms arise, or if is not improving with treatment.  I discussed the assessment and treatment plan with the patient. The patient was provided an opportunity to ask questions and all were answered. The patient agreed with the plan and demonstrated an understanding of the instructions.   The patient was advised to call back or seek an in-person evaluation  if the symptoms worsen or if the condition fails to improve as anticipated.   Mable Paris, FNP

## 2019-05-23 NOTE — Assessment & Plan Note (Signed)
Symptoms of obstructive sleep apnea.  Referral to pulmonology for further evaluation

## 2019-05-23 NOTE — Assessment & Plan Note (Addendum)
BP Readings from Last 3 Encounters:  02/10/19 (!) 142/98  01/27/19 (!) 122/96  01/13/19 (!) 142/98   Unsure if at goal.  This patient was experiencing sexual dysfunction on hydrochlorothiazide alone, advised to stop hydrochlorothiazide, and to resume amlodipine at 5mg . Patient will purchase BP cuff and monitor as well.  Close f/u with BP check, labs.

## 2019-05-23 NOTE — Telephone Encounter (Signed)
Left patient a message to call us back to update his insurance information.

## 2019-05-23 NOTE — Assessment & Plan Note (Signed)
Pending a1c. 

## 2019-06-01 ENCOUNTER — Encounter: Payer: Self-pay | Admitting: Family

## 2019-06-01 ENCOUNTER — Ambulatory Visit (INDEPENDENT_AMBULATORY_CARE_PROVIDER_SITE_OTHER): Payer: 59 | Admitting: Family

## 2019-06-01 ENCOUNTER — Other Ambulatory Visit: Payer: Self-pay

## 2019-06-01 DIAGNOSIS — I10 Essential (primary) hypertension: Secondary | ICD-10-CM

## 2019-06-01 LAB — CBC WITH DIFFERENTIAL/PLATELET
Basophils Absolute: 0 10*3/uL (ref 0.0–0.1)
Basophils Relative: 0.3 % (ref 0.0–3.0)
Eosinophils Absolute: 0.4 10*3/uL (ref 0.0–0.7)
Eosinophils Relative: 4 % (ref 0.0–5.0)
HCT: 45.8 % (ref 39.0–52.0)
Hemoglobin: 15.2 g/dL (ref 13.0–17.0)
Lymphocytes Relative: 30.5 % (ref 12.0–46.0)
Lymphs Abs: 2.7 10*3/uL (ref 0.7–4.0)
MCHC: 33.2 g/dL (ref 30.0–36.0)
MCV: 88.2 fl (ref 78.0–100.0)
Monocytes Absolute: 0.6 10*3/uL (ref 0.1–1.0)
Monocytes Relative: 7 % (ref 3.0–12.0)
Neutro Abs: 5.1 10*3/uL (ref 1.4–7.7)
Neutrophils Relative %: 58.2 % (ref 43.0–77.0)
Platelets: 239 10*3/uL (ref 150.0–400.0)
RBC: 5.2 Mil/uL (ref 4.22–5.81)
RDW: 14.2 % (ref 11.5–15.5)
WBC: 8.8 10*3/uL (ref 4.0–10.5)

## 2019-06-01 LAB — COMPREHENSIVE METABOLIC PANEL
ALT: 59 U/L — ABNORMAL HIGH (ref 0–53)
AST: 34 U/L (ref 0–37)
Albumin: 4.5 g/dL (ref 3.5–5.2)
Alkaline Phosphatase: 60 U/L (ref 39–117)
BUN: 12 mg/dL (ref 6–23)
CO2: 28 mEq/L (ref 19–32)
Calcium: 9.5 mg/dL (ref 8.4–10.5)
Chloride: 102 mEq/L (ref 96–112)
Creatinine, Ser: 0.9 mg/dL (ref 0.40–1.50)
GFR: 116.19 mL/min (ref >60.00)
Glucose, Bld: 95 mg/dL (ref 70–99)
Potassium: 4.1 mEq/L (ref 3.5–5.1)
Sodium: 136 mEq/L (ref 135–145)
Total Bilirubin: 0.6 mg/dL (ref 0.2–1.2)
Total Protein: 7.6 g/dL (ref 6.0–8.3)

## 2019-06-01 LAB — LIPID PANEL
Cholesterol: 202 mg/dL — ABNORMAL HIGH (ref 0–200)
HDL: 36.8 mg/dL — ABNORMAL LOW (ref >39.00)
LDL Cholesterol: 127 mg/dL — ABNORMAL HIGH (ref 0–99)
NonHDL: 165.69
Total CHOL/HDL Ratio: 6
Triglycerides: 194 mg/dL — ABNORMAL HIGH (ref 0.0–149.0)
VLDL: 38.8 mg/dL (ref 0.0–40.0)

## 2019-06-01 LAB — HEMOGLOBIN A1C: Hgb A1c MFr Bld: 7.1 % — ABNORMAL HIGH (ref 4.6–6.5)

## 2019-06-01 NOTE — Assessment & Plan Note (Signed)
Very pleased with blood pressure.  Controlled on amlodipine alone.  We will continue

## 2019-06-01 NOTE — Progress Notes (Signed)
Subjective:    Patient ID: Jordan Sims, male    DOB: [birth year], 34 y.o.   MRN: [id number]  CC: Jordan Sims is a 34 y.o. male who presents today for follow up.   HPI: Is a follow-up on blood pressure.  Feels well, no concerns today. Has been taking amlodipine consistently.  Did not check blood pressure at home.  Denies chest pain, shortness of breath, palpitations. He received a call from pulmonology to schedule appointment for OSA evaluation.    HISTORY:  No past medical history on file. Past Surgical History:  Procedure Laterality Date  . acl mcl left knee Left    Family History  Problem Relation Age of Onset  . Hypertension Father     Allergies: Patient has no known allergies. Current Outpatient Medications on File Prior to Visit  Medication Sig Dispense Refill  . amLODipine (NORVASC) 5 MG tablet Take 1 tablet (5 mg total) by mouth daily. 90 tablet 1   No current facility-administered medications on file prior to visit.    Social History   Tobacco Use  . Smoking status: Light Tobacco Smoker  . Smokeless tobacco: Never Used  . Tobacco comment: Ocassionally  Substance Use Topics  . Alcohol use: Yes  . Drug use: No    Review of Systems  Constitutional: Negative for chills and fever.  HENT: Negative for congestion, ear pain, rhinorrhea, sinus pressure and sore throat.   Respiratory: Negative for cough, shortness of breath and wheezing.   Cardiovascular: Negative for chest pain and palpitations.  Gastrointestinal: Negative for diarrhea, nausea and vomiting.  Genitourinary: Negative for dysuria.  Musculoskeletal: Negative for myalgias.  Skin: Negative for rash.  Neurological: Negative for headaches.  Hematological: Negative for adenopathy.      Objective:    BP 110/72   Pulse 90   Temp 98 F (36.7 C) (Temporal)   Ht 6\' 8"  (2.032 m)   Wt (!) 424 lb 12.8 oz (192.7 kg)   SpO2 97%   BMI 46.67 kg/m  BP Readings from Last 3 Encounters:  06/01/19 110/72   02/10/19 (!) 142/98  01/27/19 (!) 122/96   Wt Readings from Last 3 Encounters:  06/01/19 (!) 424 lb 12.8 oz (192.7 kg)  05/23/19 (!) 400 lb (181.4 kg)  01/27/19 (!) 413 lb 12.8 oz (187.7 kg)    Physical Exam Vitals reviewed.  Constitutional:      Appearance: He is well-developed.  Cardiovascular:     Rate and Rhythm: Regular rhythm.     Heart sounds: Normal heart sounds.  Pulmonary:     Effort: Pulmonary effort is normal. No respiratory distress.     Breath sounds: Normal breath sounds. No wheezing, rhonchi or rales.  Skin:    General: Skin is warm and dry.  Neurological:     Mental Status: He is alert.  Psychiatric:        Speech: Speech normal.        Behavior: Behavior normal.        Assessment & Plan:   Problem List Items Addressed This Visit      Cardiovascular and Mediastinum   Essential hypertension    Very pleased with blood pressure.  Controlled on amlodipine alone.  We will continue          I am having Jordan Sims maintain his amLODipine.   No orders of the defined types were placed in this encounter.   Return precautions given.   Risks, benefits, and alternatives of  the medications and treatment plan prescribed today were discussed, and patient expressed understanding.   Education regarding symptom management and diagnosis given to patient on AVS.  Continue to follow with Jordan Hawthorne, Jordan Sims for routine health maintenance.   Jordan Sims and I agreed with plan.   Jordan Paris, Jordan Sims

## 2019-06-01 NOTE — Patient Instructions (Signed)
Blood pressure looks great.   Stay safe!   Managing Your Hypertension Hypertension is commonly called high blood pressure. This is when the force of your blood pressing against the walls of your arteries is too strong. Arteries are blood vessels that carry blood from your heart throughout your body. Hypertension forces the heart to work harder to pump blood, and may cause the arteries to become narrow or stiff. Having untreated or uncontrolled hypertension can cause heart attack, stroke, kidney disease, and other problems. What are blood pressure readings? A blood pressure reading consists of a higher number over a lower number. Ideally, your blood pressure should be below 120/80. The first ("top") number is called the systolic pressure. It is a measure of the pressure in your arteries as your heart beats. The second ("bottom") number is called the diastolic pressure. It is a measure of the pressure in your arteries as the heart relaxes. What does my blood pressure reading mean? Blood pressure is classified into four stages. Based on your blood pressure reading, your health care provider may use the following stages to determine what type of treatment you need, if any. Systolic pressure and diastolic pressure are measured in a unit called mm Hg. Normal  Systolic pressure: below 120.  Diastolic pressure: below 80. Elevated  Systolic pressure: 120-129.  Diastolic pressure: below 80. Hypertension stage 1  Systolic pressure: 130-139.  Diastolic pressure: 80-89. Hypertension stage 2  Systolic pressure: 140 or above.  Diastolic pressure: 90 or above. What health risks are associated with hypertension? Managing your hypertension is an important responsibility. Uncontrolled hypertension can lead to:  A heart attack.  A stroke.  A weakened blood vessel (aneurysm).  Heart failure.  Kidney damage.  Eye damage.  Metabolic syndrome.  Memory and concentration problems. What changes  can I make to manage my hypertension? Hypertension can be managed by making lifestyle changes and possibly by taking medicines. Your health care provider will help you make a plan to bring your blood pressure within a normal range. Eating and drinking   Eat a diet that is high in fiber and potassium, and low in salt (sodium), added sugar, and fat. An example eating plan is called the DASH (Dietary Approaches to Stop Hypertension) diet. To eat this way: ? Eat plenty of fresh fruits and vegetables. Try to fill half of your plate at each meal with fruits and vegetables. ? Eat whole grains, such as whole wheat pasta, brown rice, or whole grain bread. Fill about one quarter of your plate with whole grains. ? Eat low-fat diary products. ? Avoid fatty cuts of meat, processed or cured meats, and poultry with skin. Fill about one quarter of your plate with lean proteins such as fish, chicken without skin, beans, eggs, and tofu. ? Avoid premade and processed foods. These tend to be higher in sodium, added sugar, and fat.  Reduce your daily sodium intake. Most people with hypertension should eat less than 1,500 mg of sodium a day.  Limit alcohol intake to no more than 1 drink a day for nonpregnant women and 2 drinks a day for men. One drink equals 12 oz of beer, 5 oz of wine, or 1 oz of hard liquor. Lifestyle  Work with your health care provider to maintain a healthy body weight, or to lose weight. Ask what an ideal weight is for you.  Get at least 30 minutes of exercise that causes your heart to beat faster (aerobic exercise) most days of the week.  Activities may include walking, swimming, or biking.  Include exercise to strengthen your muscles (resistance exercise), such as weight lifting, as part of your weekly exercise routine. Try to do these types of exercises for 30 minutes at least 3 days a week.  Do not use any products that contain nicotine or tobacco, such as cigarettes and e-cigarettes. If  you need help quitting, ask your health care provider.  Control any long-term (chronic) conditions you have, such as high cholesterol or diabetes. Monitoring  Monitor your blood pressure at home as told by your health care provider. Your personal target blood pressure may vary depending on your medical conditions, your age, and other factors.  Have your blood pressure checked regularly, as often as told by your health care provider. Working with your health care provider  Review all the medicines you take with your health care provider because there may be side effects or interactions.  Talk with your health care provider about your diet, exercise habits, and other lifestyle factors that may be contributing to hypertension.  Visit your health care provider regularly. Your health care provider can help you create and adjust your plan for managing hypertension. Will I need medicine to control my blood pressure? Your health care provider may prescribe medicine if lifestyle changes are not enough to get your blood pressure under control, and if:  Your systolic blood pressure is 130 or higher.  Your diastolic blood pressure is 80 or higher. Take medicines only as told by your health care provider. Follow the directions carefully. Blood pressure medicines must be taken as prescribed. The medicine does not work as well when you skip doses. Skipping doses also puts you at risk for problems. Contact a health care provider if:  You think you are having a reaction to medicines you have taken.  You have repeated (recurrent) headaches.  You feel dizzy.  You have swelling in your ankles.  You have trouble with your vision. Get help right away if:  You develop a severe headache or confusion.  You have unusual weakness or numbness, or you feel faint.  You have severe pain in your chest or abdomen.  You vomit repeatedly.  You have trouble breathing. Summary  Hypertension is when the  force of blood pumping through your arteries is too strong. If this condition is not controlled, it may put you at risk for serious complications.  Your personal target blood pressure may vary depending on your medical conditions, your age, and other factors. For most people, a normal blood pressure is less than 120/80.  Hypertension is managed by lifestyle changes, medicines, or both. Lifestyle changes include weight loss, eating a healthy, low-sodium diet, exercising more, and limiting alcohol. This information is not intended to replace advice given to you by your health care provider. Make sure you discuss any questions you have with your health care provider. Document Revised: 07/23/2018 Document Reviewed: 02/27/2016 Elsevier Patient Education  [place].

## 2019-06-03 ENCOUNTER — Other Ambulatory Visit: Payer: Self-pay

## 2019-06-03 ENCOUNTER — Other Ambulatory Visit: Payer: Self-pay | Admitting: Family

## 2019-06-03 ENCOUNTER — Encounter: Payer: Self-pay | Admitting: Family

## 2019-06-03 DIAGNOSIS — E785 Hyperlipidemia, unspecified: Secondary | ICD-10-CM | POA: Insufficient documentation

## 2019-06-03 DIAGNOSIS — I10 Essential (primary) hypertension: Secondary | ICD-10-CM

## 2019-06-03 DIAGNOSIS — E119 Type 2 diabetes mellitus without complications: Secondary | ICD-10-CM | POA: Insufficient documentation

## 2019-06-03 MED ORDER — METFORMIN HCL ER 500 MG PO TB24: 500.0000 mg | ORAL_TABLET | Freq: Every day | ORAL | 0 refills | Status: DC

## 2019-06-03 MED ORDER — ROSUVASTATIN CALCIUM 10 MG PO TABS: 10.0000 mg | ORAL_TABLET | Freq: Every day | ORAL | 0 refills | Status: DC

## 2019-06-03 NOTE — Progress Notes (Unsigned)
metfr

## 2019-06-14 ENCOUNTER — Encounter: Payer: Self-pay | Admitting: Adult Health

## 2019-06-14 ENCOUNTER — Other Ambulatory Visit: Payer: Self-pay

## 2019-06-14 ENCOUNTER — Ambulatory Visit (INDEPENDENT_AMBULATORY_CARE_PROVIDER_SITE_OTHER): Payer: 59 | Admitting: Adult Health

## 2019-06-14 VITALS — BP 128/86 | HR 98 | Temp 98.6°F | Ht >= 80 in | Wt >= 6400 oz

## 2019-06-14 DIAGNOSIS — R0683 Snoring: Secondary | ICD-10-CM | POA: Diagnosis not present

## 2019-06-14 DIAGNOSIS — Z6841 Body Mass Index (BMI) 40.0 and over, adult: Secondary | ICD-10-CM | POA: Diagnosis not present

## 2019-06-14 DIAGNOSIS — G4733 Obstructive sleep apnea (adult) (pediatric): Secondary | ICD-10-CM

## 2019-06-14 NOTE — Patient Instructions (Signed)
Set up for Sleep study  Work on healthy weight  Do not drive is sleepy .  Follow up with Dr. Belia Heman in 2-3 months and As needed

## 2019-06-14 NOTE — Assessment & Plan Note (Signed)
Healthy weight loss 

## 2019-06-14 NOTE — Progress Notes (Signed)
@Patient  ID: , male    DOB: 06-Feb-1986, 34 y.o.   MRN: [id number]  Chief Complaint  Patient presents with  . Follow-up    Consult for sleep     Referring provider: [id number], FNP  HPI: 34 year old male never smoker presents June 14, 2019 for sleep evaluation for snoring daytime sleepiness and witnessed apneic events . Medical history significant for hypertension and morbid obesity  TEST/EVENTS :   06/14/2019 Follow up :  Sleep Consult  Patient presents for referral from primary care provider for sleep consult.  Patient says that he has daytime sleepiness snoring and wife has witnessed apneic events.  He has restless sleep.  Patient says for as long as he can member he has snored even as a child.  Patient says he has several family members that have sleep apnea.  Patient says he does not have much caffeine intake.  He has not drinking any soda tea or coffee in the last month.  Patient says he is trying to lose weight but his weight has continued to escalate.  He weighed 315 pounds when he graduated from high school.  Is currently 428.  His current BMI is 46.  Patient says it is very hard for him to lose weight.  Patient says he does feel sleepy a lot especially if he sits down.  He has no sleepiness while driving.  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  Patient says he does occasionally watch TV to go to sleep.  Says he has 5 kids that does not get a whole lot sleep sleeps only about 4 hours each night.  Typically goes to bed about 10 PM and gets up around 3 AM.  Says he works most every day.  He has hypertension that is well controlled. Has no known heart disease.  Epworth is 8.    No Known Allergies   There is no immunization history on file for this patient.  No past medical history on file.   Medical history is hypertension Surgical history none Social history has social alcohol use.  Is a never smoker.  Patient is  married.  Has 5 children.  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  Tobacco History: Social History   Tobacco Use  Smoking Status Light Tobacco Smoker  Smokeless Tobacco Never Used  Tobacco Comment   Ocassionally   Ready to quit: Not Answered Counseling given: Not Answered Comment: Ocassionally   Outpatient Medications Prior to Visit  Medication Sig Dispense Refill  . amLODipine (NORVASC) 5 MG tablet Take 1 tablet (5 mg total) by mouth daily. 90 tablet 1  . metFORMIN (GLUCOPHAGE XR) 500 MG 24 hr tablet Take 1 tablet (500 mg total) by mouth at bedtime. 90 tablet 0  . rosuvastatin (CRESTOR) 10 MG tablet Take 1 tablet (10 mg total) by mouth daily. 90 tablet 0   No facility-administered medications prior to visit.     Review of Systems:   Constitutional:   No  weight loss, night sweats,  Fevers, chills, fatigue, or  lassitude.  HEENT:   No headaches,  Difficulty swallowing,  Tooth/dental problems, or  Sore throat,                No sneezing, itching, ear ache, nasal congestion, post nasal drip,   CV:  No chest pain,  Orthopnea, PND, swelling in lower extremities, anasarca, dizziness, palpitations, syncope.  GI  No heartburn, indigestion, abdominal pain, nausea, vomiting, diarrhea, change in bowel habits, loss of appetite, bloody stools.   Resp: No shortness of breath with exertion or at rest.  No excess mucus, no productive cough,  No non-productive cough,  No coughing up of blood.  No change in color of mucus.  No wheezing.  No chest wall deformity  Skin: no rash or lesions.  GU: no dysuria, change in color of urine, no urgency or frequency.  No flank pain, no hematuria   MS:  No joint pain or swelling.  No decreased range of motion.  No back pain.    Physical Exam  BP 128/86 (BP Location: Left Arm, Patient Position: Sitting, Cuff Size: Large)   Pulse 98   Temp 98.6 F (37 C) (Temporal)   Ht 6\' 8"  (2.032 m)   Wt (!) 426 lb 12.8 oz (193.6 kg)   SpO2 98%  Comment: on ra  BMI 46.89 kg/m   GEN: A/Ox3; pleasant , NAD, BMI 46   HEENT:  [place]/AT, , NOSE-clear, THROAT-clear, no lesions, no postnasal drip or exudate noted.  Class 2-3 MP airway  NECK:  Supple w/ fair ROM; no JVD; normal carotid impulses w/o bruits; no thyromegaly or nodules palpated; no lymphadenopathy.    RESP  Clear  P & A; w/o, wheezes/ rales/ or rhonchi. no accessory muscle use, no dullness to percussion  CARD:  RRR, no m/r/g, no peripheral edema, pulses intact, no cyanosis or clubbing.  GI:   Soft & nt; nml bowel sounds; no organomegaly or masses detected.   Musco: Warm bil, no deformities or joint swelling noted.   Neuro: alert, no focal deficits noted.    Skin: Warm, no lesions or rashes    Lab Results:  CBC    Component Value Date/Time   WBC 8.8 06/01/2019 1220   RBC 5.20 06/01/2019 1220   HGB 15.2 06/01/2019 1220   HCT 45.8 06/01/2019 1220   PLT 239.0 06/01/2019 1220   MCV 88.2 06/01/2019 1220   MCHC 33.2 06/01/2019 1220   RDW 14.2 06/01/2019 1220   LYMPHSABS 2.7 06/01/2019 1220   MONOABS 0.6 06/01/2019 1220   EOSABS 0.4 06/01/2019 1220   BASOSABS 0.0 06/01/2019 1220    BMET    Component Value Date/Time   NA 136 06/01/2019 1220   K 4.1 06/01/2019 1220   CL 102 06/01/2019 1220   CO2 28 06/01/2019 1220   GLUCOSE 95 06/01/2019 1220   BUN 12 06/01/2019 1220   CREATININE 0.90 06/01/2019 1220   CALCIUM 9.5 06/01/2019 1220    BNP No results found for: BNP  ProBNP No results found for: PROBNP  Imaging: No results found.    No flowsheet data found.  No results found for: NITRICOXIDE      Assessment & Plan:   Snores Snoring , daytime hypersomnolence, witnessed apneic events, restless sleep.  Patient has high BMI at 46, he is at risk for possible underlying obstructive sleep apnea.   Set patient up for sleep study in lab. (Home sleep study is not available at this time) Patient education on sleep apnea and potential treatment options  if his test is positive.. Patient education on healthy weight loss. Advised not to drive if sleepy  Plan  Patient Instructions  Set up for Sleep study  Work on healthy weight  Do not drive is sleepy .  Follow up with Dr. Mortimer Fries in 2-3 months and As needed       Morbid  obesity with BMI of 45.0-49.9, adult (HCC) Healthy weight loss     Rubye Oaks, NP 06/14/2019

## 2019-06-14 NOTE — Assessment & Plan Note (Signed)
Snoring , daytime hypersomnolence, witnessed apneic events, restless sleep.  Patient has high BMI at 46, he is at risk for possible underlying obstructive sleep apnea.   Set patient up for sleep study in lab. (Home sleep study is not available at this time) Patient education on sleep apnea and potential treatment options if his test is positive.. Patient education on healthy weight loss. Advised not to drive if sleepy  Plan  Patient Instructions  Set up for Sleep study  Work on healthy weight  Do not drive is sleepy .  Follow up with Dr. Belia Heman in 2-3 months and As needed

## 2019-07-04 ENCOUNTER — Telehealth: Payer: Self-pay | Admitting: Adult Health

## 2019-07-04 NOTE — Telephone Encounter (Signed)
Pt scheduled for covid test 07/06/2019 at 10:45 am at [facility].  Spoke with the pt and notified of appt date and time and he verbalized understanding.

## 2019-07-06 ENCOUNTER — Other Ambulatory Visit: Payer: Self-pay

## 2019-07-06 ENCOUNTER — Other Ambulatory Visit
Admission: RE | Admit: 2019-07-06 | Discharge: 2019-07-06 | Disposition: A | Discharge status: Home / Self Care | Payer: 59 | Source: Ambulatory Visit | Attending: Family | Admitting: Family

## 2019-07-06 DIAGNOSIS — Z01812 Encounter for preprocedural laboratory examination: Secondary | ICD-10-CM | POA: Insufficient documentation

## 2019-07-06 DIAGNOSIS — G4733 Obstructive sleep apnea (adult) (pediatric): Secondary | ICD-10-CM | POA: Diagnosis not present

## 2019-07-06 DIAGNOSIS — Z6841 Body Mass Index (BMI) 40.0 and over, adult: Secondary | ICD-10-CM | POA: Insufficient documentation

## 2019-07-06 DIAGNOSIS — Z20822 Contact with and (suspected) exposure to covid-19: Secondary | ICD-10-CM | POA: Diagnosis not present

## 2019-07-06 DIAGNOSIS — E119 Type 2 diabetes mellitus without complications: Secondary | ICD-10-CM | POA: Insufficient documentation

## 2019-07-06 DIAGNOSIS — I10 Essential (primary) hypertension: Secondary | ICD-10-CM | POA: Insufficient documentation

## 2019-07-06 LAB — SARS CORONAVIRUS 2 (TAT 6-24 HRS): SARS Coronavirus 2: NEGATIVE

## 2019-07-08 ENCOUNTER — Encounter: Payer: Self-pay | Admitting: Adult Health

## 2019-07-08 ENCOUNTER — Ambulatory Visit: Payer: 59

## 2019-07-08 DIAGNOSIS — G4733 Obstructive sleep apnea (adult) (pediatric): Secondary | ICD-10-CM | POA: Diagnosis not present

## 2019-07-11 ENCOUNTER — Other Ambulatory Visit: Payer: Self-pay

## 2019-07-12 DIAGNOSIS — G4733 Obstructive sleep apnea (adult) (pediatric): Secondary | ICD-10-CM | POA: Diagnosis not present

## 2019-07-18 ENCOUNTER — Other Ambulatory Visit (INDEPENDENT_AMBULATORY_CARE_PROVIDER_SITE_OTHER): Payer: 59

## 2019-07-18 ENCOUNTER — Other Ambulatory Visit: Payer: Self-pay

## 2019-07-18 DIAGNOSIS — I10 Essential (primary) hypertension: Secondary | ICD-10-CM | POA: Diagnosis not present

## 2019-07-19 LAB — COMPREHENSIVE METABOLIC PANEL
ALT: 49 U/L (ref 0–53)
AST: 32 U/L (ref 0–37)
Albumin: 4.8 g/dL (ref 3.5–5.2)
Alkaline Phosphatase: 66 U/L (ref 39–117)
BUN: 14 mg/dL (ref 6–23)
CO2: 27 mEq/L (ref 19–32)
Calcium: 9.9 mg/dL (ref 8.4–10.5)
Chloride: 106 mEq/L (ref 96–112)
Creatinine, Ser: 1.18 mg/dL (ref 0.40–1.50)
GFR: 84.93 mL/min (ref >60.00)
Glucose, Bld: 106 mg/dL — ABNORMAL HIGH (ref 70–99)
Potassium: 4.5 mEq/L (ref 3.5–5.1)
Sodium: 140 mEq/L (ref 135–145)
Total Bilirubin: 0.3 mg/dL (ref 0.2–1.2)
Total Protein: 7.6 g/dL (ref 6.0–8.3)

## 2019-07-20 ENCOUNTER — Telehealth: Payer: Self-pay | Admitting: Adult Health

## 2019-07-20 DIAGNOSIS — G4733 Obstructive sleep apnea (adult) (pediatric): Secondary | ICD-10-CM

## 2019-07-20 NOTE — Telephone Encounter (Signed)
Okay , can yall help me find results I do not see in chart.  Thanks

## 2019-07-20 NOTE — Telephone Encounter (Signed)
Tammy please advise on patient's sleep study results,   Pt called to get results of his sleep study. Cb#: [phone]

## 2019-07-21 NOTE — Telephone Encounter (Signed)
ATC Patient.  LM to call back for sleep results. 

## 2019-07-21 NOTE — Telephone Encounter (Signed)
Sleep study shows severe OSA  AHI 40/hr .   Recommend beginning CPAP auto 12-16cm H2O  Mask of choice. Enroll in [place].  Wear CPAP everynight for at least 4-6 hrs/more . Do not drive if sleepy  Work on healthy weight .   Needs ov in 6-8 weeks (has to be on CPAP for 31 days before office visit )   If he wants to discuss results can set up visit and go over and discuss treatment option otherwise can begin CPAP as above.

## 2019-07-21 NOTE — Telephone Encounter (Signed)
Pt's results are able to be seen under the procedures tab of pt's chart.

## 2019-07-22 NOTE — Telephone Encounter (Signed)
Spoke with pt and notified of results per Rubye Oaks, NP. Pt verbalized understanding and denied any questions. Order sent for CPAP and 2 month reminder placed.

## 2019-07-22 NOTE — Telephone Encounter (Signed)
Pt returning call for sleep results. Can be reached at [phone]

## 2019-08-24 ENCOUNTER — Ambulatory Visit: Payer: 59 | Admitting: Family

## 2019-09-05 ENCOUNTER — Telehealth: Payer: Self-pay | Admitting: Adult Health

## 2019-09-05 NOTE — Telephone Encounter (Signed)
Ov note with TP and sleep study printed and faxed to the number provided  I called and spoke with the pt and notified that this was done

## 2019-09-14 ENCOUNTER — Ambulatory Visit (INDEPENDENT_AMBULATORY_CARE_PROVIDER_SITE_OTHER): Payer: 59 | Admitting: Family

## 2019-09-14 ENCOUNTER — Encounter: Payer: Self-pay | Admitting: Family

## 2019-09-14 ENCOUNTER — Other Ambulatory Visit: Payer: Self-pay

## 2019-09-14 VITALS — BP 110/72 | HR 95 | Temp 97.5°F | Ht >= 80 in | Wt >= 6400 oz

## 2019-09-14 DIAGNOSIS — I10 Essential (primary) hypertension: Secondary | ICD-10-CM

## 2019-09-14 DIAGNOSIS — G4733 Obstructive sleep apnea (adult) (pediatric): Secondary | ICD-10-CM

## 2019-09-14 DIAGNOSIS — Z6841 Body Mass Index (BMI) 40.0 and over, adult: Secondary | ICD-10-CM

## 2019-09-14 DIAGNOSIS — E119 Type 2 diabetes mellitus without complications: Secondary | ICD-10-CM

## 2019-09-14 MED ORDER — BUPROPION HCL ER (XL) 150 MG PO TB24: ORAL_TABLET | ORAL | 3 refills | Status: DC

## 2019-09-14 MED ORDER — LOSARTAN POTASSIUM 50 MG PO TABS: 50.0000 mg | ORAL_TABLET | Freq: Every day | ORAL | 3 refills | Status: DC

## 2019-09-14 NOTE — Patient Instructions (Addendum)
Stop amlodipine We will start losartan which protects your kidneys in setting of diabetes  Labs in ONE week  Start wellbutrin, titrate as we discussed  We discussed today starting medication called Wellbutrin.  As also discussed, you must limit alcohol on this medication as alcohol and Wellbutrin together may increase your risk for seizure.  You may drink no more than 1 alcoholic beverage on this medication.  A standard drink is 12 ounces of regular beer, which is usually about 5% alcohol OR 5 ounces of wine, which is typically about 12% alcohol OR   1.5 ounces of distilled spirits, which is about 40% alcohol    Call me with any concerns  Nice to see you!  Bupropion extended-release tablets (Depression/Mood Disorders) What is this medicine? BUPROPION (byoo PROE pee on) is used to treat depression. This medicine may be used for other purposes; ask your health care provider or pharmacist if you have questions. COMMON BRAND NAME(S): Aplenzin, Budeprion XL, Forfivo XL, Wellbutrin XL What should I tell my health care provider before I take this medicine? They need to know if you have any of these conditions:  an eating disorder, such as anorexia or bulimia  bipolar disorder or psychosis  diabetes or high blood sugar, treated with medication  glaucoma  head injury or brain tumor  heart disease, previous heart attack, or irregular heart beat  high blood pressure  kidney or liver disease  seizures (convulsions)  suicidal thoughts or a previous suicide attempt  Tourette's syndrome  weight loss  an unusual or allergic reaction to bupropion, other medicines, foods, dyes, or preservatives  breast-feeding  pregnant or trying to become pregnant How should I use this medicine? Take this medicine by mouth with a glass of water. Follow the directions on the prescription label. You can take it with or without food. If it upsets your stomach, take it with food. Do not crush,  chew, or cut these tablets. This medicine is taken once daily at the same time each day. Do not take your medicine more often than directed. Do not stop taking this medicine suddenly except upon the advice of your doctor. Stopping this medicine too quickly may cause serious side effects or your condition may worsen. A special MedGuide will be given to you by the pharmacist with each prescription and refill. Be sure to read this information carefully each time. Talk to your pediatrician regarding the use of this medicine in children. Special care may be needed. Overdosage: If you think you have taken too much of this medicine contact a poison control center or emergency room at once. NOTE: This medicine is only for you. Do not share this medicine with others. What if I miss a dose? If you miss a dose, skip the missed dose and take your next tablet at the regular time. Do not take double or extra doses. What may interact with this medicine? Do not take this medicine with any of the following medications:  linezolid  MAOIs like Azilect, Carbex, Eldepryl, Marplan, Nardil, and Parnate  methylene blue (injected into a vein)  other medicines that contain bupropion like Zyban This medicine may also interact with the following medications:  alcohol  certain medicines for anxiety or sleep  certain medicines for blood pressure like metoprolol, propranolol  certain medicines for depression or psychotic disturbances  certain medicines for HIV or AIDS like efavirenz, lopinavir, nelfinavir, ritonavir  certain medicines for irregular heart beat like propafenone, flecainide  certain medicines for Parkinson's disease  like amantadine, levodopa  certain medicines for seizures like carbamazepine, phenytoin, phenobarbital  cimetidine  clopidogrel  cyclophosphamide  digoxin  furazolidone  isoniazid  nicotine  orphenadrine  procarbazine  steroid medicines like prednisone or  cortisone  stimulant medicines for attention disorders, weight loss, or to stay awake  tamoxifen  theophylline  thiotepa  ticlopidine  tramadol  warfarin This list may not describe all possible interactions. Give your health care provider a list of all the medicines, herbs, non-prescription drugs, or dietary supplements you use. Also tell them if you smoke, drink alcohol, or use illegal drugs. Some items may interact with your medicine. What should I watch for while using this medicine? Tell your doctor if your symptoms do not get better or if they get worse. Visit your doctor or healthcare provider for regular checks on your progress. Because it may take several weeks to see the full effects of this medicine, it is important to continue your treatment as prescribed by your doctor. This medicine may cause serious skin reactions. They can happen weeks to months after starting the medicine. Contact your healthcare provider right away if you notice fevers or flu-like symptoms with a rash. The rash may be red or purple and then turn into blisters or peeling of the skin. Or, you might notice a red rash with swelling of the face, lips or lymph nodes in your neck or under your arms. Patients and their families should watch out for new or worsening thoughts of suicide or depression. Also watch out for sudden changes in feelings such as feeling anxious, agitated, panicky, irritable, hostile, aggressive, impulsive, severely restless, overly excited and hyperactive, or not being able to sleep. If this happens, especially at the beginning of treatment or after a change in dose, call your healthcare provider. Avoid alcoholic drinks while taking this medicine. Drinking large amounts of alcoholic beverages, using sleeping or anxiety medicines, or quickly stopping the use of these agents while taking this medicine may increase your risk for a seizure. Do not drive or use heavy machinery until you know how this  medicine affects you. This medicine can impair your ability to perform these tasks. Do not take this medicine close to bedtime. It may prevent you from sleeping. Your mouth may get dry. Chewing sugarless gum or sucking hard candy, and drinking plenty of water may help. Contact your doctor if the problem does not go away or is severe. The tablet shell for some brands of this medicine does not dissolve. This is normal. The tablet shell may appear whole in the stool. This is not a cause for concern. What side effects may I notice from receiving this medicine? Side effects that you should report to your doctor or health care professional as soon as possible:  allergic reactions like skin rash, itching or hives, swelling of the face, lips, or tongue  breathing problems  changes in vision  confusion  elevated mood, decreased need for sleep, racing thoughts, impulsive behavior  fast or irregular heartbeat  hallucinations, loss of contact with reality  increased blood pressure  rash, fever, and swollen lymph nodes  redness, blistering, peeling or loosening of the skin, including inside the mouth  seizures  suicidal thoughts or other mood changes  unusually weak or tired  vomiting Side effects that usually do not require medical attention (report to your doctor or health care professional if they continue or are bothersome):  constipation  headache  loss of appetite  nausea  tremors  weight  loss This list may not describe all possible side effects. Call your doctor for medical advice about side effects. You may report side effects to FDA at 1-800-FDA-1088. Where should I keep my medicine? Keep out of the reach of children. Store at room temperature between 15 and 30 degrees C (59 and 86 degrees F). Throw away any unused medicine after the expiration date. NOTE: This sheet is a summary. It may not cover all possible information. If you have questions about this medicine, talk  to your doctor, pharmacist, or health care provider.  2020 Elsevier/Gold Standard (2018-06-24 13:45:31)

## 2019-09-14 NOTE — Assessment & Plan Note (Signed)
Stable however in setting of DM opted to dc CCB and start ARB. Labs in one week

## 2019-09-14 NOTE — Assessment & Plan Note (Signed)
Discussed victoza versus wellbutrin. Patient more comfortable with oral medication. Discussed alcohol use on occasion with friends. He is aware that he can one alcoholic beverage on wellbutrin and I have printed on his AVS as well. Furthermore, advised against alcohol with the goal of weightloss.  Patient verbalized understanding of all

## 2019-09-14 NOTE — Progress Notes (Signed)
Subjective:    Patient ID: Jordan Sims, male    DOB: [birth year], 34 y.o.   MRN: [id number]  CC: Jordan Sims is a 34 y.o. male who presents today for follow up.   HPI: Frustrated by weight gain.  Skips breakfast. Lunch is [employer]. Dinner is typically pasta. Has stopped eating fried foods. Stopped soda, sweet tea.  Snack vienna and nabs. No formal exercise.  Had been 350lb in 2016.  No h/o eating disorder, seizure.  Drinks 10-12 beers once in a while with friends.    DM- compliant with metformin  HTN- compliant with amlodipine.   OSA- started cipap machine. Feels some improvement with cipap. No HA.   HLD- compliant with crestor  Elevated liver enzymes   HISTORY:  History reviewed. No pertinent past medical history. Past Surgical History:  Procedure Laterality Date  . acl mcl left knee Left    Family History  Problem Relation Age of Onset  . Hypertension Father     Allergies: Patient has no known allergies. Current Outpatient Medications on File Prior to Visit  Medication Sig Dispense Refill  . metFORMIN (GLUCOPHAGE XR) 500 MG 24 hr tablet Take 1 tablet (500 mg total) by mouth at bedtime. 90 tablet 0  . rosuvastatin (CRESTOR) 10 MG tablet Take 1 tablet (10 mg total) by mouth daily. 90 tablet 0   No current facility-administered medications on file prior to visit.    Social History   Tobacco Use  . Smoking status: Light Tobacco Smoker  . Smokeless tobacco: Never Used  . Tobacco comment: Ocassionally  Substance Use Topics  . Alcohol use: Yes  . Drug use: No    Review of Systems  Constitutional: Negative for chills, fatigue (improved) and fever.  Respiratory: Negative for cough.   Cardiovascular: Negative for chest pain and palpitations.  Gastrointestinal: Negative for nausea and vomiting.      Objective:    BP 110/72   Pulse 95   Temp (!) 97.5 F (36.4 C) (Temporal)   Ht 6\' 8"  (2.032 m)   Wt (!) 423 lb 6.4 oz (192.1 kg)   SpO2 97%    BMI 46.51 kg/m  BP Readings from Last 3 Encounters:  09/14/19 110/72  06/14/19 128/86  06/01/19 110/72   Wt Readings from Last 3 Encounters:  09/14/19 (!) 423 lb 6.4 oz (192.1 kg)  06/14/19 (!) 426 lb 12.8 oz (193.6 kg)  06/01/19 (!) 424 lb 12.8 oz (192.7 kg)    Physical Exam Vitals reviewed.  Constitutional:      Appearance: He is well-developed.  Cardiovascular:     Rate and Rhythm: Regular rhythm.     Heart sounds: Normal heart sounds.  Pulmonary:     Effort: Pulmonary effort is normal. No respiratory distress.     Breath sounds: Normal breath sounds. No wheezing, rhonchi or rales.  Skin:    General: Skin is warm and dry.  Neurological:     Mental Status: He is alert.  Psychiatric:        Speech: Speech normal.        Behavior: Behavior normal.        Assessment & Plan:   Problem List Items Addressed This Visit      Cardiovascular and Mediastinum   Essential hypertension - Primary    Stable however in setting of DM opted to dc CCB and start ARB. Labs in one week      Relevant Medications   losartan (COZAAR) 50 MG  tablet   Other Relevant Orders   Comprehensive metabolic panel     Respiratory   OSA (obstructive sleep apnea)    Compliant with CPAP.  daytime fatigue has improved.  Will follow        Endocrine   DM (diabetes mellitus) ([place])   Relevant Medications   losartan (COZAAR) 50 MG tablet   Other Relevant Orders   Hemoglobin A1c   Lipid panel   Microalbumin / creatinine urine ratio     Other   Morbid obesity with BMI of 45.0-49.9, adult ([place])    Discussed victoza versus wellbutrin. Patient more comfortable with oral medication. Discussed alcohol use on occasion with friends. He is aware that he can one alcoholic beverage on wellbutrin and I have printed on his AVS as well. Furthermore, advised against alcohol with the goal of weightloss.  Patient verbalized understanding of all      Relevant Medications   buPROPion (WELLBUTRIN XL) 150 MG 24 hr  tablet       I have discontinued Jordan Sims's amLODipine. I am also having him start on losartan and buPROPion. Additionally, I am having him maintain his metFORMIN and rosuvastatin.   Meds ordered this encounter  Medications  . losartan (COZAAR) 50 MG tablet    Sig: Take 1 tablet (50 mg total) by mouth daily.    Dispense:  90 tablet    Refill:  3    Order Specific Question:   Supervising Provider    Answer:   Deborra Medina L [[id number]]  . buPROPion (WELLBUTRIN XL) 150 MG 24 hr tablet    Sig: Start 150 mg ER PO qam, increase after 3 days to 300 mg qam.    Dispense:  60 tablet    Refill:  3    Order Specific Question:   Supervising Provider    Answer:   Crecencio Mc [[id number]]    Return precautions given.   Risks, benefits, and alternatives of the medications and treatment plan prescribed today were discussed, and patient expressed understanding.   Education regarding symptom management and diagnosis given to patient on AVS.  Continue to follow with Burnard Hawthorne, FNP for routine health maintenance.   Drema Dallas and I agreed with plan.   Mable Paris, FNP

## 2019-09-14 NOTE — Assessment & Plan Note (Signed)
Compliant with CPAP.  daytime fatigue has improved.  Will follow

## 2019-09-21 ENCOUNTER — Other Ambulatory Visit: Payer: Self-pay

## 2019-09-21 ENCOUNTER — Other Ambulatory Visit (INDEPENDENT_AMBULATORY_CARE_PROVIDER_SITE_OTHER): Payer: 59

## 2019-09-21 DIAGNOSIS — I10 Essential (primary) hypertension: Secondary | ICD-10-CM | POA: Diagnosis not present

## 2019-09-21 DIAGNOSIS — E119 Type 2 diabetes mellitus without complications: Secondary | ICD-10-CM

## 2019-09-22 LAB — LIPID PANEL
Cholesterol: 123 mg/dL (ref 0–200)
HDL: 33.4 mg/dL — ABNORMAL LOW (ref >39.00)
LDL Cholesterol: 68 mg/dL (ref 0–99)
NonHDL: 90.07
Total CHOL/HDL Ratio: 4
Triglycerides: 110 mg/dL (ref 0.0–149.0)
VLDL: 22 mg/dL (ref 0.0–40.0)

## 2019-09-22 LAB — COMPREHENSIVE METABOLIC PANEL
ALT: 42 U/L (ref 0–53)
AST: 30 U/L (ref 0–37)
Albumin: 4.7 g/dL (ref 3.5–5.2)
Alkaline Phosphatase: 63 U/L (ref 39–117)
BUN: 10 mg/dL (ref 6–23)
CO2: 27 mEq/L (ref 19–32)
Calcium: 9.8 mg/dL (ref 8.4–10.5)
Chloride: 103 mEq/L (ref 96–112)
Creatinine, Ser: 1.05 mg/dL (ref 0.40–1.50)
GFR: 97.08 mL/min (ref >60.00)
Glucose, Bld: 96 mg/dL (ref 70–99)
Potassium: 4.2 mEq/L (ref 3.5–5.1)
Sodium: 138 mEq/L (ref 135–145)
Total Bilirubin: 0.5 mg/dL (ref 0.2–1.2)
Total Protein: 7.3 g/dL (ref 6.0–8.3)

## 2019-09-22 LAB — MICROALBUMIN / CREATININE URINE RATIO
Creatinine,U: 238.4 mg/dL
Microalb Creat Ratio: 0.8 mg/g (ref 0.0–30.0)
Microalb, Ur: 1.8 mg/dL (ref 0.0–1.9)

## 2019-09-23 LAB — HEMOGLOBIN A1C: Hgb A1c MFr Bld: 6.6 % — ABNORMAL HIGH (ref 4.6–6.5)

## 2019-09-28 ENCOUNTER — Other Ambulatory Visit: Payer: Self-pay

## 2019-09-28 DIAGNOSIS — E119 Type 2 diabetes mellitus without complications: Secondary | ICD-10-CM

## 2019-09-28 MED ORDER — METFORMIN HCL ER 500 MG PO TB24: 1000.0000 mg | ORAL_TABLET | Freq: Every day | ORAL | 0 refills | Status: DC

## 2019-10-04 ENCOUNTER — Telehealth: Payer: Self-pay | Admitting: Adult Health

## 2019-10-10 NOTE — Telephone Encounter (Signed)
Patient contacted, insurance did not receive the fax sent on 09/05/19. Documents related to sleep study faxed to new number [phone]. Patient updated that fax was resent.

## 2019-10-28 ENCOUNTER — Ambulatory Visit: Payer: 59 | Admitting: Family

## 2019-11-04 ENCOUNTER — Other Ambulatory Visit: Payer: Self-pay | Admitting: Family

## 2019-11-04 DIAGNOSIS — E119 Type 2 diabetes mellitus without complications: Secondary | ICD-10-CM

## 2019-11-09 ENCOUNTER — Ambulatory Visit (INDEPENDENT_AMBULATORY_CARE_PROVIDER_SITE_OTHER): Payer: 59 | Admitting: Family

## 2019-11-09 ENCOUNTER — Other Ambulatory Visit: Payer: Self-pay

## 2019-11-09 ENCOUNTER — Encounter: Payer: Self-pay | Admitting: Family

## 2019-11-09 DIAGNOSIS — E119 Type 2 diabetes mellitus without complications: Secondary | ICD-10-CM

## 2019-11-09 DIAGNOSIS — Z6841 Body Mass Index (BMI) 40.0 and over, adult: Secondary | ICD-10-CM

## 2019-11-09 DIAGNOSIS — I10 Essential (primary) hypertension: Secondary | ICD-10-CM

## 2019-11-09 NOTE — Progress Notes (Signed)
Subjective:    Patient ID: Jordan Sims, male    DOB: March 02, 1986, 34 y.o.   MRN: [id number]  CC: Jordan Sims is a 34 y.o. male who presents today for follow up.   HPI: Feels well, no complaints today  Doing well on Wellbutrin .eating less portions. Not drinking soda, sweet tea.No depression. Not more irriated.   HTN-compliant losartan.  No chest pain or shortness of breath  DM- has been on metformin 1000mg .   HLD - compliant with crestor   Declines tetanus, pneumonia vaccine. He doesn't take vaccines HISTORY:  No past medical history on file. Past Surgical History:  Procedure Laterality Date  . acl mcl left knee Left    Family History  Problem Relation Age of Onset  . Hypertension Father     Allergies: Patient has no known allergies. Current Outpatient Medications on File Prior to Visit  Medication Sig Dispense Refill  . buPROPion (WELLBUTRIN XL) 150 MG 24 hr tablet Start 150 mg ER PO qam, increase after 3 days to 300 mg qam. 60 tablet 3  . losartan (COZAAR) 50 MG tablet Take 1 tablet (50 mg total) by mouth daily. 90 tablet 3  . metFORMIN (GLUCOPHAGE-XR) 500 MG 24 hr tablet TAKE 1 TABLET BY MOUTH AT BEDTIME 90 tablet 0  . rosuvastatin (CRESTOR) 10 MG tablet Take 1 tablet by mouth once daily 30 tablet 0   No current facility-administered medications on file prior to visit.    Social History   Tobacco Use  . Smoking status: Light Tobacco Smoker  . Smokeless tobacco: Never Used  . Tobacco comment: Ocassionally  Vaping Use  . Vaping Use: Never used  Substance Use Topics  . Alcohol use: Yes  . Drug use: No    Review of Systems  Constitutional: Negative for chills and fever.  Respiratory: Negative for cough.   Cardiovascular: Negative for chest pain and palpitations.  Gastrointestinal: Negative for nausea and vomiting.      Objective:    BP 116/84 (BP Location: Left Arm, Patient Position: Sitting)   Pulse 97   Temp 98.7 F (37.1 C)   Ht 6\' 8"  (2.032  m)   Wt (!) 409 lb 3.2 oz (185.6 kg)   SpO2 96%   BMI 44.95 kg/m  BP Readings from Last 3 Encounters:  11/09/19 116/84  09/14/19 110/72  06/14/19 128/86   Wt Readings from Last 3 Encounters:  11/09/19 (!) 409 lb 3.2 oz (185.6 kg)  09/14/19 (!) 423 lb 6.4 oz (192.1 kg)  06/14/19 (!) 426 lb 12.8 oz (193.6 kg)    Physical Exam Vitals reviewed.  Constitutional:      Appearance: He is well-developed.  Cardiovascular:     Rate and Rhythm: Regular rhythm.     Heart sounds: Normal heart sounds.  Pulmonary:     Effort: Pulmonary effort is normal. No respiratory distress.     Breath sounds: Normal breath sounds. No wheezing, rhonchi or rales.  Skin:    General: Skin is warm and dry.  Neurological:     Mental Status: He is alert.  Psychiatric:        Speech: Speech normal.        Behavior: Behavior normal.        Assessment & Plan:   Problem List Items Addressed This Visit      Cardiovascular and Mediastinum   Essential hypertension    Stable, continue regimen        Endocrine   DM (  diabetes mellitus) ([facility])    Stable, continue Metformin        Other   Morbid obesity with BMI of 45.0-49.9, adult ([facility])    Pleased with weight loss.  We will continue to follow patient.  Continue wellbutrin          I am having Jordan Sims. Vancott maintain his losartan, buPROPion, metFORMIN, and rosuvastatin.   No orders of the defined types were placed in this encounter.   Return precautions given.   Risks, benefits, and alternatives of the medications and treatment plan prescribed today were discussed, and patient expressed understanding.   Education regarding symptom management and diagnosis given to patient on AVS.  Continue to follow with Allegra Grana, FNP for routine health maintenance.   Cletus Gash and I agreed with plan.   Rennie Plowman, FNP

## 2019-11-09 NOTE — Assessment & Plan Note (Signed)
Stable, continue Metformin. °

## 2019-11-09 NOTE — Assessment & Plan Note (Signed)
Pleased with weight loss.  We will continue to follow patient.  Continue wellbutrin

## 2019-11-09 NOTE — Patient Instructions (Signed)
Nice to see you and huge congratulations on the weight loss.  See you in 3 months!

## 2019-11-09 NOTE — Assessment & Plan Note (Signed)
Stable, continue regimen. 

## 2019-11-30 ENCOUNTER — Ambulatory Visit: Payer: 59 | Admitting: Family

## 2019-12-05 ENCOUNTER — Other Ambulatory Visit: Payer: Self-pay | Admitting: Critical Care Medicine

## 2019-12-05 ENCOUNTER — Other Ambulatory Visit: Payer: Self-pay

## 2019-12-05 DIAGNOSIS — Z20822 Contact with and (suspected) exposure to covid-19: Secondary | ICD-10-CM

## 2019-12-07 LAB — SARS-COV-2, NAA 2 DAY TAT

## 2019-12-07 LAB — NOVEL CORONAVIRUS, NAA: SARS-CoV-2, NAA: NOT DETECTED

## 2019-12-12 ENCOUNTER — Other Ambulatory Visit: Payer: Self-pay

## 2019-12-25 ENCOUNTER — Other Ambulatory Visit: Payer: Self-pay | Admitting: Family

## 2020-01-10 ENCOUNTER — Ambulatory Visit: Payer: 59 | Admitting: Internal Medicine

## 2020-02-01 ENCOUNTER — Other Ambulatory Visit: Payer: Self-pay | Admitting: Family

## 2020-02-04 ENCOUNTER — Other Ambulatory Visit: Payer: Self-pay

## 2020-02-10 ENCOUNTER — Ambulatory Visit: Payer: 59 | Admitting: Family

## 2020-02-17 ENCOUNTER — Other Ambulatory Visit: Payer: Self-pay

## 2020-02-17 ENCOUNTER — Telehealth (INDEPENDENT_AMBULATORY_CARE_PROVIDER_SITE_OTHER): Payer: 59 | Admitting: Family

## 2020-02-17 ENCOUNTER — Encounter: Payer: Self-pay | Admitting: Family

## 2020-02-17 VITALS — Ht >= 80 in | Wt >= 6400 oz

## 2020-02-17 DIAGNOSIS — Z6841 Body Mass Index (BMI) 40.0 and over, adult: Secondary | ICD-10-CM

## 2020-02-17 DIAGNOSIS — E119 Type 2 diabetes mellitus without complications: Secondary | ICD-10-CM

## 2020-02-17 DIAGNOSIS — E782 Mixed hyperlipidemia: Secondary | ICD-10-CM | POA: Diagnosis not present

## 2020-02-17 DIAGNOSIS — G4733 Obstructive sleep apnea (adult) (pediatric): Secondary | ICD-10-CM

## 2020-02-17 DIAGNOSIS — I10 Essential (primary) hypertension: Secondary | ICD-10-CM

## 2020-02-17 DIAGNOSIS — Z1159 Encounter for screening for other viral diseases: Secondary | ICD-10-CM

## 2020-02-17 MED ORDER — BUPROPION HCL ER (XL) 300 MG PO TB24
300.0000 mg | ORAL_TABLET | Freq: Every day | ORAL | 5 refills | Status: DC
Start: 2020-02-17 — End: 2020-02-17

## 2020-02-17 MED ORDER — ROSUVASTATIN CALCIUM 10 MG PO TABS: 10.0000 mg | ORAL_TABLET | Freq: Every day | ORAL | 1 refills | Status: DC

## 2020-02-17 MED ORDER — METFORMIN HCL ER 500 MG PO TB24: 500.0000 mg | ORAL_TABLET | Freq: Every day | ORAL | 3 refills | Status: DC

## 2020-02-17 MED ORDER — BUPROPION HCL ER (XL) 300 MG PO TB24: 300.0000 mg | ORAL_TABLET | Freq: Every day | ORAL | 3 refills | Status: DC

## 2020-02-17 NOTE — Progress Notes (Signed)
Virtual Visit via Video Note  I connected with@  on 02/17/20 at  2:30 PM EDT by a video enabled telemedicine application and verified that I am speaking with the correct person using two identifiers.  Location patient: home Location provider:work  Persons participating in the virtual visit: patient, provider  I discussed the limitations of evaluation and management by telemedicine and the availability of in person appointments. The patient expressed understanding and agreed to proceed.   HPI:  Feels well today  No complaints  HTN- compliant with cozaar . No cp, sob.  Obesity- has taking wellbutrin 300mg .Not sure if he has lost more weight however would like to continue medication for now.  Sleeping well and not more irritable.   DM- compliant with metformin.   Due for eye exam  HLD - compliant with crestor  OSA- compliant with cipap. Upcoming appointment with Dr 03/2020   ROS: See pertinent positives and negatives per HPI.    EXAM:  VITALS per patient if applicable: Ht 6\' 8"  (2.032 m)   Wt (!) 409 lb (185.5 kg)   BMI 44.93 kg/m  BP Readings from Last 3 Encounters:  11/09/19 116/84  09/14/19 110/72  06/14/19 128/86   Wt Readings from Last 3 Encounters:  02/17/20 (!) 409 lb (185.5 kg)  11/09/19 (!) 409 lb 3.2 oz (185.6 kg)  09/14/19 (!) 423 lb 6.4 oz (192.1 kg)    GENERAL: alert, oriented, appears well and in no acute distress  HEENT: atraumatic, conjunttiva clear, no obvious abnormalities on inspection of external nose and ears  NECK: normal movements of the head and neck  LUNGS: on inspection no signs of respiratory distress, breathing rate appears normal, no obvious gross SOB, gasping or wheezing  CV: no obvious cyanosis  MS: moves all visible extremities without noticeable abnormality  PSYCH/NEURO: pleasant and cooperative, no obvious depression or anxiety, speech and thought processing grossly intact  ASSESSMENT AND PLAN:  Discussed the  following assessment and plan:  Problem List Items Addressed This Visit      Cardiovascular and Mediastinum   Essential hypertension - Primary    Has been controlled in the past. No readings from home. Continue losartan 50mg . He will come in person for follow up      Relevant Medications   rosuvastatin (CRESTOR) 10 MG tablet   Other Relevant Orders   Comprehensive metabolic panel     Respiratory   OSA (obstructive sleep apnea)     Endocrine   DM (diabetes mellitus) ([facility])     Controlled. Continue metformin 500mg  qd.      Relevant Medications   metFORMIN (GLUCOPHAGE-XR) 500 MG 24 hr tablet   rosuvastatin (CRESTOR) 10 MG tablet   Other Relevant Orders   Hemoglobin A1c   HIV Antibody (routine testing w rflx)     Other   HLD (hyperlipidemia)    Controlled. Continue crestor 10mg       Relevant Medications   rosuvastatin (CRESTOR) 10 MG tablet   Morbid obesity with BMI of 45.0-49.9, adult ([facility])   Relevant Medications   buPROPion (WELLBUTRIN XL) 300 MG 24 hr tablet   metFORMIN (GLUCOPHAGE-XR) 500 MG 24 hr tablet    Other Visit Diagnoses    Encounter for hepatitis C screening test for low risk patient       Relevant Orders   Hepatitis C antibody      -we discussed possible serious and likely etiologies, options for evaluation and workup, limitations of telemedicine visit vs in person visit, treatment,  treatment risks and precautions. Pt prefers to treat via telemedicine empirically rather then risking or undertaking an in person visit at this moment.  .   I discussed the assessment and treatment plan with the patient. The patient was provided an opportunity to ask questions and all were answered. The patient agreed with the plan and demonstrated an understanding of the instructions.   The patient was advised to call back or seek an in-person evaluation if the symptoms worsen or if the condition fails to improve as anticipated.   Rennie Plowman, FNP

## 2020-02-17 NOTE — Assessment & Plan Note (Signed)
Controlled. Continue crestor 10mg 

## 2020-02-17 NOTE — Assessment & Plan Note (Signed)
Has been controlled in the past. No readings from home. Continue losartan 50mg . He will come in person for follow up

## 2020-02-17 NOTE — Assessment & Plan Note (Signed)
Controlled. Continue metformin 500mg  qd.

## 2020-02-17 NOTE — Patient Instructions (Signed)
Nice to see you!   

## 2020-03-14 ENCOUNTER — Telehealth: Payer: Self-pay | Admitting: Internal Medicine

## 2020-03-14 ENCOUNTER — Encounter: Payer: Self-pay | Admitting: Internal Medicine

## 2020-03-14 ENCOUNTER — Other Ambulatory Visit: Payer: Self-pay

## 2020-03-14 ENCOUNTER — Ambulatory Visit (INDEPENDENT_AMBULATORY_CARE_PROVIDER_SITE_OTHER): Payer: 59 | Admitting: Internal Medicine

## 2020-03-14 DIAGNOSIS — G4733 Obstructive sleep apnea (adult) (pediatric): Secondary | ICD-10-CM

## 2020-03-14 NOTE — Progress Notes (Signed)
@Patient  ID: , male    DOB: [birth year], 34 y.o.   MRN: [id number]   Referring provider: [id number], FNP   CC  follow up OSA  HPI: Patient with poor compliance with CPAP Auto CPAP 12 to 16 cm water pressure AHI down to 1.1 when he does use his CPAP 20% compliance for days 13% compliance for greater than 4 hours  Positive encouragement enforced He will need to continue CPAP nightly   TEST/EVENTS :  PSG reveal AHI 40   Medical history is hypertension Surgical history none Social history has social alcohol use.  Is a never smoker.  Patient is married.  Has 5 children.  He works as a [employer].  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  Tobacco History: Social History   Tobacco Use  Smoking Status Light Tobacco Smoker  Smokeless Tobacco Never Used  Tobacco Comment   Ocassionally   Ready to quit: Not Answered Counseling given: Not Answered Comment: Ocassionally   Outpatient Medications Prior to Visit  Medication Sig Dispense Refill  . buPROPion (WELLBUTRIN XL) 300 MG 24 hr tablet Take 1 tablet (300 mg total) by mouth daily with breakfast. 90 tablet 3  . losartan (COZAAR) 50 MG tablet Take 1 tablet (50 mg total) by mouth daily. 90 tablet 3  . metFORMIN (GLUCOPHAGE-XR) 500 MG 24 hr tablet Take 1 tablet (500 mg total) by mouth at bedtime. 90 tablet 3  . rosuvastatin (CRESTOR) 10 MG tablet Take 1 tablet (10 mg total) by mouth daily. 90 tablet 1   No facility-administered medications prior to visit.      Review of Systems:  Gen:  Denies  fever, sweats, chills weight loss  HEENT: Denies blurred vision, double vision, ear pain, eye pain, hearing loss, nose bleeds, sore throat Cardiac:  No dizziness, chest pain or heaviness, chest tightness,edema, No JVD Resp:   No cough, -sputum production, -shortness of breath,-wheezing, -hemoptysis,  Gi: Denies swallowing difficulty, stomach pain, nausea or vomiting, diarrhea, constipation, bowel incontinence Gu:  Denies  bladder incontinence, burning urine Ext:   Denies Joint pain, stiffness or swelling Skin: Denies  skin rash, easy bruising or bleeding or hives Endoc:  Denies polyuria, polydipsia , polyphagia or weight change Psych:   Denies depression, insomnia or hallucinations  Other:  All other systems negative     Lab Results:  CBC    Component Value Date/Time   WBC 8.8 06/01/2019 1220   RBC 5.20 06/01/2019 1220   HGB 15.2 06/01/2019 1220   HCT 45.8 06/01/2019 1220   PLT 239.0 06/01/2019 1220   MCV 88.2 06/01/2019 1220   MCHC 33.2 06/01/2019 1220   RDW 14.2 06/01/2019 1220   LYMPHSABS 2.7 06/01/2019 1220   MONOABS 0.6 06/01/2019 1220   EOSABS 0.4 06/01/2019 1220   BASOSABS 0.0 06/01/2019 1220    BMET    Component Value Date/Time   NA 138 09/21/2019 1449   K 4.2 09/21/2019 1449   CL 103 09/21/2019 1449   CO2 27 09/21/2019 1449   GLUCOSE 96 09/21/2019 1449   BUN 10 09/21/2019 1449   CREATININE 1.05 09/21/2019 1449   CALCIUM 9.8 09/21/2019 1449    ASSESSMENT AND PLAN  OSA Review of sleep apnea Poor compliance Advised to increase compliance Compliance report reviewed thoroughly with patient   Morbid obesity with BMI of 45.0-49.9, adult (HCC) Healthy weight loss  COVID-19 EDUCATION: The signs and symptoms of COVID-19 were discussed with the patient and how to seek care for testing.  The importance of social distancing was discussed today. Hand Washing Techniques and avoid touching face was advised.    MEDICATION ADJUSTMENTS/LABS AND TESTS ORDERED: Continue CPAP as prescribed   CURRENT MEDICATIONS REVIEWED AT LENGTH WITH PATIENT TODAY   Patient satisfied with Plan of action and management. All questions answered  Follow-up 3 months  Total time spent 24 minutes   Wallis B[place]berg Santiago Glad, M.D.  [facility]  Medical Director [facility] Medical Director [facility] Cardio-Pulmonary Department

## 2020-03-14 NOTE — Patient Instructions (Signed)
Continue CPAP as prescribed. 

## 2020-03-14 NOTE — Telephone Encounter (Signed)
Spoke to patient, who stated that his coworker is covid positive. He would like for today's appointment to be by phone.   Dr. Belia Heman, please advise. Thanks

## 2020-03-14 NOTE — Telephone Encounter (Signed)
Per Dr. Belia Heman verbally- okay for phone visit.  Patient is aware and voiced his understanding.  Nothing further needed.

## 2020-03-14 NOTE — Telephone Encounter (Signed)
Yes, phone visit is ok

## 2020-05-25 ENCOUNTER — Other Ambulatory Visit: Payer: Self-pay

## 2020-05-25 ENCOUNTER — Encounter: Payer: Self-pay | Admitting: Family

## 2020-05-25 ENCOUNTER — Ambulatory Visit: Payer: 59 | Admitting: Family

## 2020-05-25 VITALS — BP 122/70 | HR 93 | Temp 98.3°F | Ht >= 80 in | Wt >= 6400 oz

## 2020-05-25 DIAGNOSIS — G4733 Obstructive sleep apnea (adult) (pediatric): Secondary | ICD-10-CM

## 2020-05-25 DIAGNOSIS — I10 Essential (primary) hypertension: Secondary | ICD-10-CM

## 2020-05-25 DIAGNOSIS — Z6841 Body Mass Index (BMI) 40.0 and over, adult: Secondary | ICD-10-CM

## 2020-05-25 DIAGNOSIS — E119 Type 2 diabetes mellitus without complications: Secondary | ICD-10-CM

## 2020-05-25 DIAGNOSIS — E785 Hyperlipidemia, unspecified: Secondary | ICD-10-CM

## 2020-05-25 LAB — POCT GLYCOSYLATED HEMOGLOBIN (HGB A1C): Hemoglobin A1C: 6.2 % — AB (ref 4.0–5.6)

## 2020-05-25 MED ORDER — OZEMPIC (0.25 OR 0.5 MG/DOSE) 2 MG/1.5ML ~~LOC~~ SOPN: 0.5000 mg | PEN_INJECTOR | SUBCUTANEOUS | 3 refills | Status: DC

## 2020-05-25 MED ORDER — BUPROPION HCL ER (XL) 150 MG PO TB24: ORAL_TABLET | ORAL | 0 refills | Status: DC

## 2020-05-25 NOTE — Assessment & Plan Note (Addendum)
Lab Results  Component Value Date   HGBA1C 6.6 (H) 09/21/2019  Controlled. We agreed to start ozempic 0.25 to help with weight loss.  Continue metformin 500mg  though we titrate up at follow up for additional weight loss.

## 2020-05-25 NOTE — Progress Notes (Signed)
Subjective:    Patient ID: Jordan Sims, male    DOB: 04-26-1985, 35 y.o.   MRN: [id number]  CC: HARSHAAN Sims is a 35 y.o. male who presents today for follow up.   HPI: Complains of recent weight gain  No fatigue, HA. Depression. Sleeping well.  Doesn't eat breakfast. Eats lunch around 3 and supper at 8. Gets up at 330am.  NO fried foods. Diet green tea, gatorade, water. Doesn't eat much sweets. No exercise. 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 He is interested in weight loss surgery.   No personal or family h/o thyroid cancer.   HTN- compliant with losartan 50mg . No cp, sob.  No decongestants. No caffeine. No nsaids.    OSA- wearing cipap approx 6 hours per night, 4 days per week and hasnt noticed improvement in fatigue.   HLD- compliant crestor 10mg .       HISTORY:  History reviewed. No pertinent past medical history. Past Surgical History:  Procedure Laterality Date  . acl mcl left knee Left    Family History  Problem Relation Age of Onset  . Hypertension Father   . Thyroid cancer Neg Hx     Allergies: Patient has no known allergies. Current Outpatient Medications on File Prior to Visit  Medication Sig Dispense Refill  . losartan (COZAAR) 50 MG tablet Take 1 tablet (50 mg total) by mouth daily. 90 tablet 3  . metFORMIN (GLUCOPHAGE-XR) 500 MG 24 hr tablet Take 1 tablet (500 mg total) by mouth at bedtime. 90 tablet 3  . rosuvastatin (CRESTOR) 10 MG tablet Take 1 tablet (10 mg total) by mouth daily. 90 tablet 1   No current facility-administered medications on file prior to visit.    Social History   Tobacco Use  . Smoking status: Light Tobacco Smoker  . Smokeless tobacco: Never Used  . Tobacco comment: Ocassionally  Vaping Use  . Vaping Use: Never used  Substance Use Topics  . Alcohol use: Yes  . Drug use: No    Review of Systems  Constitutional: Negative for chills and fever.  HENT: Negative for trouble swallowing.    Respiratory: Negative for cough.   Cardiovascular: Negative for chest pain and palpitations.  Gastrointestinal: Negative for nausea and vomiting.      Objective:    BP 122/70   Pulse 93   Temp 98.3 F (36.8 C) (Oral)   Ht 6\' 8"  (2.032 m)   Wt (!) 423 lb 3.2 oz (192 kg)   SpO2 97%   BMI 46.49 kg/m  BP Readings from Last 3 Encounters:  05/25/20 122/70  11/09/19 116/84  09/14/19 110/72   Wt Readings from Last 3 Encounters:  05/25/20 (!) 423 lb 3.2 oz (192 kg)  02/17/20 (!) 409 lb (185.5 kg)  11/09/19 (!) 409 lb 3.2 oz (185.6 kg)    Physical Exam Vitals reviewed.  Constitutional:      Appearance: He is well-developed and well-nourished.  Neck:     Thyroid: No thyroid mass, thyromegaly or thyroid tenderness.  Cardiovascular:     Rate and Rhythm: Regular rhythm.     Heart sounds: Normal heart sounds.  Pulmonary:     Effort: Pulmonary effort is normal. No respiratory distress.     Breath sounds: Normal breath sounds. No wheezing, rhonchi or rales.  Skin:    General: Skin is warm and dry.  Neurological:     Mental Status: He is alert.  Psychiatric:  Mood and Affect: Mood and affect normal.        Speech: Speech normal.        Behavior: Behavior normal.        Assessment & Plan:   Problem List Items Addressed This Visit      Cardiovascular and Mediastinum   Essential hypertension    Controlled. Continue losartan 50mg       Relevant Medications   Semaglutide,0.25 or 0.5MG /DOS, (OZEMPIC, 0.25 OR 0.5 MG/DOSE,) 2 MG/1.5ML SOPN   Other Relevant Orders   Comprehensive metabolic panel (Completed)   Lipid panel (Completed)   Microalbumin / creatinine urine ratio (Completed)   TSH (Completed)   PSA (Completed)   VITAMIN D 25 Hydroxy (Vit-D Deficiency, Fractures) (Completed)   HIV Antibody (routine testing w rflx) (Completed)   Hepatitis C antibody (Completed)     Respiratory   OSA (obstructive sleep apnea)    Working on compliance with cipap; advised to  wear nightly to see if fatigue improves. He will continue to follow with pulmonology        Endocrine   DM (diabetes mellitus) (HCC)    Lab Results  Component Value Date   HGBA1C 6.6 (H) 09/21/2019  Controlled. We agreed to start ozempic 0.25 to help with weight loss.  Continue metformin 500mg  though we titrate up at follow up for additional weight loss.       Relevant Medications   Semaglutide,0.25 or 0.5MG /DOS, (OZEMPIC, 0.25 OR 0.5 MG/DOSE,) 2 MG/1.5ML SOPN   Other Relevant Orders   POCT HgB A1C (Completed)     Other   HLD (hyperlipidemia)    Controlled. Continue crestor 10mg       Morbid obesity with BMI of 45.0-49.9, adult (HCC) - Primary    Discussed weight loss surgery and I have provided him with local information for him to attend seminars. Stop wellbutrin , wean off by reducing to 150mg  and start ozempic to further aid in weight loss.       Relevant Medications   Semaglutide,0.25 or 0.5MG /DOS, (OZEMPIC, 0.25 OR 0.5 MG/DOSE,) 2 MG/1.5ML SOPN   buPROPion (WELLBUTRIN XL) 150 MG 24 hr tablet       I have discontinued E. Danish's buPROPion. I am also having him start on Ozempic (0.25 or 0.5 MG/DOSE) and buPROPion. Additionally, I am having him maintain his losartan, metFORMIN, and rosuvastatin.   Meds ordered this encounter  Medications  . Semaglutide,0.25 or 0.5MG /DOS, (OZEMPIC, 0.25 OR 0.5 MG/DOSE,) 2 MG/1.5ML SOPN    Sig: Inject 0.5 mg into the skin once a week.    Dispense:  1.5 mL    Refill:  3    Order Specific Question:   Supervising Provider    Answer:   , TERESA L [[id number]]  . buPROPion (WELLBUTRIN XL) 150 MG 24 hr tablet    Sig: Take 150 mg ER PO qam for 3-5 days, then stop.    Dispense:  10 tablet    Refill:  0    Order Specific Question:   Supervising Provider    Answer:   09-07-1984 [[id number]]    Return precautions given.   Risks, benefits, and alternatives of the medications and treatment plan prescribed today were discussed, and  patient expressed understanding.   Education regarding symptom management and diagnosis given to patient on AVS.  Continue to follow with , FNP for routine health maintenance.   Marcello Moores and I agreed with plan.   [name], FNP

## 2020-05-25 NOTE — Patient Instructions (Addendum)
Start ozempic Continue metformin  Wean to wellbutrin 150mg  for 3-5 days, then stop medication completely   As we discussed, weight loss clinics to consider.   Surgical and NON Surgical options:   [facility]   [address]  [place], [place]  [place] [email or web address]   You do NOT need a referral to meet with them; however you have to do a free seminar prior to first appointment.   [facility]  [address]  [place], [place]  [address] [phone] No referral needed.   Surgical options.   [facility] through [facility] - Weight-loss seminar in [place];  for more   information, call the [place] at [phone]. You may also find more information at [phone] for FREE weight loss surgery seminar.  A referral is needed for direct consult for surgery; however I suggest a free seminar FIRST to learn about options.  GOOOD LUCK!   Calorie Counting for Weight Loss Calories are energy you get from the things you eat and drink. Your body uses this energy to keep you going throughout the day. The number of calories you eat affects your weight. When you eat more calories than your body needs, your body stores the extra calories as fat. When you eat fewer calories than your body needs, your body burns fat to get the energy it needs. Calorie counting means keeping track of how many calories you eat and drink each day. If you make sure to eat fewer calories than your body needs, you should lose weight. In order for calorie counting to work, you will need to eat the number of calories that are right for you in a day to lose a healthy amount of weight per week. A healthy amount of weight to lose per week is usually 1-2 lb (0.5-0.9 kg). A dietitian can determine how many calories you need in a day and give you suggestions on how to reach your  calorie goal.  WHAT IS MY MY PLAN? My goal is to have 1800  calories per day.  If I have this many calories per day, I should lose around 1-2 pounds per week. WHAT DO I NEED TO KNOW ABOUT CALORIE COUNTING? In order to meet your daily calorie goal, you will need to:  Find out how many calories are in each food you would like to eat. Try to do this before you eat.  Decide how much of the food you can eat.  Write down what you ate and how many calories it had. Doing this is called keeping a food log. WHERE DO I FIND CALORIE INFORMATION? The number of calories in a food can be found on a Nutrition Facts label. Note that all the information on a label is based on a specific serving of the food. If a food does not have a Nutrition Facts label, try to look up the calories online or ask your dietitian for help. HOW DO I DECIDE HOW MUCH TO EAT? To decide how much of the food you can eat, you will need to consider both the number of calories in one serving and the size of one serving. This information can be found on the Nutrition Facts label. If a food does not have a Nutrition Facts label, look up the information online or ask your dietitian for help. Remember that calories are listed per serving. If you choose to have more than one serving of a food, you will have to  multiply the calories per serving by the amount of servings you plan to eat. For example, the label on a package of bread might say that a serving size is 1 slice and that there are 90 calories in a serving. If you eat 1 slice, you will have eaten 90 calories. If you eat 2 slices, you will have eaten 180 calories. HOW DO I KEEP A FOOD LOG? After each meal, record the following information in your food log:  What you ate.  How much of it you ate.  How many calories it had.  Then, add up your calories. Keep your food log near you, such as in a small notebook in your pocket. Another option is to use a mobile app or website. Some  programs will calculate calories for you and show you how many calories you have left each time you add an item to the log. WHAT ARE SOME CALORIE COUNTING TIPS?  Use your calories on foods and drinks that will fill you up and not leave you hungry. Some examples of this include foods like nuts and nut butters, vegetables, lean proteins, and high-fiber foods (more than 5 g fiber per serving).  Eat nutritious foods and avoid empty calories. Empty calories are calories you get from foods or beverages that do not have many nutrients, such as candy and soda. It is better to have a nutritious high-calorie food (such as an avocado) than a food with few nutrients (such as a bag of chips).  Know how many calories are in the foods you eat most often. This way, you do not have to look up how many calories they have each time you eat them.  Look out for foods that may seem like low-calorie foods but are really high-calorie foods, such as baked goods, soda, and fat-free candy.  Pay attention to calories in drinks. Drinks such as sodas, specialty coffee drinks, alcohol, and juices have a lot of calories yet do not fill you up. Choose low-calorie drinks like water and diet drinks.  Focus your calorie counting efforts on higher calorie items. Logging the calories in a garden salad that contains only vegetables is less important than calculating the calories in a milk shake.  Find a way of tracking calories that works for you. Get creative. Most people who are successful find ways to keep track of how much they eat in a day, even if they do not count every calorie. WHAT ARE SOME PORTION CONTROL TIPS?  Know how many calories are in a serving. This will help you know how many servings of a certain food you can have.  Use a measuring cup to measure serving sizes. This is helpful when you start out. With time, you will be able to estimate serving sizes for some foods.  Take some time to put servings of different  foods on your favorite plates, bowls, and cups so you know what a serving looks like.  Try not to eat straight from a bag or box. Doing this can lead to overeating. Put the amount you would like to eat in a cup or on a plate to make sure you are eating the right portion.  Use smaller plates, glasses, and bowls to prevent overeating. This is a quick and easy way to practice portion control. If your plate is smaller, less food can fit on it.  Try not to multitask while eating, such as watching TV or using your computer. If it is time to eat, sit  down at a table and enjoy your food. Doing this will help you to start recognizing when you are full. It will also make you more aware of what and how much you are eating. HOW CAN I CALORIE COUNT WHEN EATING OUT?  Ask for smaller portion sizes or child-sized portions.  Consider sharing an entree and sides instead of getting your own entree.  If you get your own entree, eat only half. Ask for a box at the beginning of your meal and put the rest of your entree in it so you are not tempted to eat it.  Look for the calories on the menu. If calories are listed, choose the lower calorie options.  Choose dishes that include vegetables, fruits, whole grains, low-fat dairy products, and lean protein. Focusing on smart food choices from each of the 5 food groups can help you stay on track at restaurants.  Choose items that are boiled, broiled, grilled, or steamed.  Choose water, milk, unsweetened iced tea, or other drinks without added sugars. If you want an alcoholic beverage, choose a lower calorie option. For example, a regular margarita can have up to 700 calories and a glass of wine has around 150.  Stay away from items that are buttered, battered, fried, or served with cream sauce. Items labeled "crispy" are usually fried, unless stated otherwise.  Ask for dressings, sauces, and syrups on the side. These are usually very high in calories, so do not eat  much of them.  Watch out for salads. Many people think salads are a healthy option, but this is often not the case. Many salads come with bacon, fried chicken, lots of cheese, fried chips, and dressing. All of these items have a lot of calories. If you want a salad, choose a garden salad and ask for grilled meats or steak. Ask for the dressing on the side, or ask for olive oil and vinegar or lemon to use as dressing.  Estimate how many servings of a food you are given. For example, a serving of cooked rice is  cup or about the size of half a tennis ball or one cupcake wrapper. Knowing serving sizes will help you be aware of how much food you are eating at restaurants. The list below tells you how big or small some common portion sizes are based on everyday objects.  1 oz--4 stacked dice.  3 oz--1 deck of cards.  1 tsp--1 dice.  1 Tbsp-- a Ping-Pong ball.  2 Tbsp--1 Ping-Pong ball.   cup--1 tennis ball or 1 cupcake wrapper.  1 cup--1 baseball.   This information is not intended to replace advice given to you by your health care provider. Make sure you discuss any questions you have with your health care provider.   Document Released: 03/31/2005 Document Revised: 04/21/2014 Document Reviewed: 02/03/2013 Elsevier Interactive Patient Education [employer]

## 2020-05-26 LAB — MICROALBUMIN / CREATININE URINE RATIO
Creatinine, Urine: 190 mg/dL (ref 20–320)
Microalb Creat Ratio: 11 mcg/mg creat (ref <30)
Microalb, Ur: 2 mg/dL

## 2020-05-28 ENCOUNTER — Encounter: Payer: Self-pay | Admitting: Family

## 2020-05-28 LAB — LIPID PANEL
Cholesterol: 127 mg/dL (ref <200)
HDL: 36 mg/dL — ABNORMAL LOW (ref >=40)
LDL Cholesterol (Calc): 66 mg/dL (calc)
Non-HDL Cholesterol (Calc): 91 mg/dL (calc) (ref <130)
Total CHOL/HDL Ratio: 3.5 (calc) (ref <5.0)
Triglycerides: 176 mg/dL — ABNORMAL HIGH (ref <150)

## 2020-05-28 LAB — TSH: TSH: 1.71 mIU/L (ref 0.40–4.50)

## 2020-05-28 LAB — COMPREHENSIVE METABOLIC PANEL
AG Ratio: 1.5 (calc) (ref 1.0–2.5)
ALT: 31 U/L (ref 9–46)
AST: 25 U/L (ref 10–40)
Albumin: 4.5 g/dL (ref 3.6–5.1)
Alkaline phosphatase (APISO): 64 U/L (ref 36–130)
BUN: 11 mg/dL (ref 7–25)
CO2: 25 mmol/L (ref 20–32)
Calcium: 9.7 mg/dL (ref 8.6–10.3)
Chloride: 104 mmol/L (ref 98–110)
Creat: 0.91 mg/dL (ref 0.60–1.35)
Globulin: 3.1 g/dL (calc) (ref 1.9–3.7)
Glucose, Bld: 102 mg/dL — ABNORMAL HIGH (ref 65–99)
Potassium: 4.3 mmol/L (ref 3.5–5.3)
Sodium: 139 mmol/L (ref 135–146)
Total Bilirubin: 0.4 mg/dL (ref 0.2–1.2)
Total Protein: 7.6 g/dL (ref 6.1–8.1)

## 2020-05-28 LAB — VITAMIN D 25 HYDROXY (VIT D DEFICIENCY, FRACTURES): Vit D, 25-Hydroxy: 15 ng/mL — ABNORMAL LOW (ref 30–100)

## 2020-05-28 LAB — PSA: PSA: 0.26 ng/mL (ref <=4.0)

## 2020-05-28 LAB — HIV ANTIBODY (ROUTINE TESTING W REFLEX): HIV 1&2 Ab, 4th Generation: NONREACTIVE

## 2020-05-28 LAB — HEPATITIS C ANTIBODY
Hepatitis C Ab: NONREACTIVE
SIGNAL TO CUT-OFF: 0.01 (ref <1.00)

## 2020-05-28 LAB — EXTRA LAV TOP TUBE

## 2020-05-28 NOTE — Assessment & Plan Note (Signed)
Controlled. Continue losartan 50mg  

## 2020-05-28 NOTE — Assessment & Plan Note (Signed)
Working on compliance with cipap; advised to wear nightly to see if fatigue improves. He will continue to follow with pulmonology

## 2020-05-28 NOTE — Assessment & Plan Note (Addendum)
Discussed weight loss surgery and I have provided him with local information for him to attend seminars. Stop wellbutrin , wean off by reducing to 150mg  and start ozempic to further aid in weight loss.

## 2020-05-28 NOTE — Assessment & Plan Note (Signed)
Controlled. Continue crestor 10mg 

## 2020-06-12 LAB — HM DIABETES EYE EXAM

## 2020-08-22 ENCOUNTER — Other Ambulatory Visit: Payer: Self-pay

## 2020-08-22 ENCOUNTER — Encounter: Payer: Self-pay | Admitting: Family

## 2020-08-22 ENCOUNTER — Ambulatory Visit: Payer: 59 | Admitting: Family

## 2020-08-22 VITALS — BP 110/80 | HR 90 | Temp 98.7°F | Ht >= 80 in | Wt >= 6400 oz

## 2020-08-22 DIAGNOSIS — E119 Type 2 diabetes mellitus without complications: Secondary | ICD-10-CM | POA: Diagnosis not present

## 2020-08-22 DIAGNOSIS — I10 Essential (primary) hypertension: Secondary | ICD-10-CM

## 2020-08-22 DIAGNOSIS — M545 Low back pain, unspecified: Secondary | ICD-10-CM | POA: Insufficient documentation

## 2020-08-22 DIAGNOSIS — M5441 Lumbago with sciatica, right side: Secondary | ICD-10-CM | POA: Diagnosis not present

## 2020-08-22 LAB — POCT GLYCOSYLATED HEMOGLOBIN (HGB A1C): Hemoglobin A1C: 5.9 % — AB (ref 4.0–5.6)

## 2020-08-22 MED ORDER — CYCLOBENZAPRINE HCL 10 MG PO TABS: 5.0000 mg | ORAL_TABLET | Freq: Every evening | ORAL | 1 refills | Status: DC | PRN

## 2020-08-22 MED ORDER — MELOXICAM 7.5 MG PO TABS: 7.5000 mg | ORAL_TABLET | Freq: Every day | ORAL | 1 refills | Status: DC | PRN

## 2020-08-22 NOTE — Assessment & Plan Note (Signed)
Controlled. Continue losartan 50mg  

## 2020-08-22 NOTE — Progress Notes (Signed)
Subjective:    Patient ID: Jordan Sims, male    DOB: 1985-12-24, 35 y.o.   MRN: [id number]  CC: Jordan Sims is a 35 y.o. male who presents today for follow up.   HPI: Complains low back pain x 4 weeks, worsening.  Worse when twisting to right. 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 Numbness radiates from low back to left foot.  In the past Jordan Sims has had low back pain.  No injury or falls.  Tylenol, icy hot without relief.   No h/o GIB.    Has Jordan Sims resumed basketball and more active. Jordan Sims is also eating less in portion.  No abdominal pain, constipation, dysuria, weakness, saddle anesthesia, trouble urinating or having a bowel movement.  No h/o cancer    lumbar xray 2018 - no abnormalities HISTORY:  History reviewed. No pertinent past medical history. Past Surgical History:  Procedure Laterality Date  . acl mcl left knee Left    Family History  Problem Relation Age of Onset  . Hypertension Father   . Thyroid cancer Neg Hx     Allergies: Patient has no known allergies. Current Outpatient Medications on File Prior to Visit  Medication Sig Dispense Refill  . losartan (COZAAR) 50 MG tablet Take 1 tablet (50 mg total) by mouth daily. 90 tablet 3  . metFORMIN (GLUCOPHAGE-XR) 500 MG 24 hr tablet Take 1 tablet (500 mg total) by mouth at bedtime. 90 tablet 3  . rosuvastatin (CRESTOR) 10 MG tablet Take 1 tablet (10 mg total) by mouth daily. 90 tablet 1  . Semaglutide,0.25 or 0.5MG /DOS, (OZEMPIC, 0.25 OR 0.5 MG/DOSE,) 2 MG/1.5ML SOPN Inject 0.5 mg into the skin once a week. 1.5 mL 3   No current facility-administered medications on file prior to visit.    Social History   Tobacco Use  . Smoking status: Light Tobacco Smoker  . Smokeless tobacco: Never Used  . Tobacco comment: Ocassionally  Vaping Use  . Vaping Use: Never used  Substance Use Topics  . Alcohol use: Yes  . Drug use: No    Review of Systems  Constitutional: Negative for  chills and fever.  Respiratory: Negative for cough.   Cardiovascular: Negative for chest pain and palpitations.  Gastrointestinal: Negative for abdominal pain, nausea and vomiting.  Musculoskeletal: Positive for back pain.  Neurological: Positive for numbness.      Objective:    BP 110/80 (BP Location: Left Arm, Patient Position: Sitting, Cuff Size: Large)   Pulse 90   Temp 98.7 F (37.1 C) (Oral)   Ht 6\' 8"  (2.032 m)   Wt (!) 413 lb 12.8 oz (187.7 kg)   SpO2 96%   BMI 45.46 kg/m  BP Readings from Last 3 Encounters:  08/22/20 110/80  05/25/20 122/70  11/09/19 116/84   Wt Readings from Last 3 Encounters:  08/22/20 (!) 413 lb 12.8 oz (187.7 kg)  05/25/20 (!) 423 lb 3.2 oz (192 kg)  02/17/20 (!) 409 lb (185.5 kg)    Physical Exam Vitals reviewed.  Constitutional:      Appearance: Jordan Sims is well-developed.  Cardiovascular:     Rate and Rhythm: Regular rhythm.     Heart sounds: Normal heart sounds.  Pulmonary:     Effort: Pulmonary effort is normal. No respiratory distress.     Breath sounds: Normal breath sounds. No wheezing or rales.  Musculoskeletal:     Lumbar back: No swelling, spasms or tenderness. Normal range of  motion. Positive right straight leg raise test. Negative left straight leg raise test.     Comments: Full range of motion with flexion, extension, lateral side bends. pain, numbness elicited with single leg raise of right leg, no left leg. No rash.  Skin:    General: Skin is warm and dry.  Neurological:     Mental Status: Jordan Sims is alert.  Psychiatric:        Speech: Speech normal.        Behavior: Behavior normal.        Assessment & Plan:   Problem List Items Addressed This Visit      Cardiovascular and Mediastinum   Essential hypertension    Controlled. Continue losartan 50mg         Endocrine   DM (diabetes mellitus) (HCC)    Lab Results  Component Value Date   HGBA1C 5.9 (A) 08/22/2020   Excellent control. Continue metformin 500mg , ozempic  0.5mg  for weight loss.       Relevant Orders   POCT HgB A1C (Completed)     Other   Low back pain - Primary    Acute. No injury. Radicular symptoms present. No alarm features. Pending Xray, UA.  Advised heat. Trial of flexeril , mobic prn as needed. Discussed short course of prednisone if no improvement. Jordan Sims will call and make sooner appointment if back pain persists.       Relevant Medications   cyclobenzaprine (FLEXERIL) 10 MG tablet   meloxicam (MOBIC) 7.5 MG tablet   Other Relevant Orders   Urinalysis, Routine w reflex microscopic   DG Lumbar Spine Complete       I have discontinued Tracy E. Noblet's buPROPion. I am also having him start on cyclobenzaprine and meloxicam. Additionally, I am having him maintain his losartan, metFORMIN, rosuvastatin, and Ozempic (0.25 or 0.5 MG/DOSE).   Meds ordered this encounter  Medications  . cyclobenzaprine (FLEXERIL) 10 MG tablet    Sig: Take 0.5 tablets (5 mg total) by mouth at bedtime as needed for muscle spasms. DO NOT TAKE WHEN DRIVING    Dispense:  30 tablet    Refill:  1    Order Specific Question:   Supervising Provider    Answer:   , TERESA L [[id number]]  . meloxicam (MOBIC) 7.5 MG tablet    Sig: Take 1 tablet (7.5 mg total) by mouth daily as needed for pain.    Dispense:  30 tablet    Refill:  1    Order Specific Question:   Supervising Provider    Answer:   Marcello Moores [[id number]]    Return precautions given.   Risks, benefits, and alternatives of the medications and treatment plan prescribed today were discussed, and patient expressed understanding.   Education regarding symptom management and diagnosis given to patient on AVS.  Continue to follow with [name], FNP for routine health maintenance.   [name] and I agreed with plan.   Allegra Grana, FNP

## 2020-08-22 NOTE — Patient Instructions (Addendum)
Heat to low back every night  Start mobic as needed ( anti inflammatory like ibuprofen)  Start flexeril as needed ( muscle relaxant)  Do not drive or operate heavy machinery while on muscle relaxant. Please do not drink alcohol. Only take this medication as needed for acute muscle spasm at bedtime. This medication make you feel drowsy so be very careful.  Stop taking if become too drowsy or somnolent as this puts you at risk for falls. Please contact our office with any questions.    Please call and make sooner appointment if back pain persists.

## 2020-08-22 NOTE — Assessment & Plan Note (Addendum)
Acute. No injury. Radicular symptoms present. No alarm features. Pending Xray, UA.  Advised heat. Trial of flexeril , mobic prn as needed. Discussed short course of prednisone if no improvement. He will call and make sooner appointment if back pain persists.

## 2020-08-22 NOTE — Assessment & Plan Note (Signed)
Lab Results  Component Value Date   HGBA1C 5.9 (A) 08/22/2020   Excellent control. Continue metformin 500mg , ozempic 0.5mg  for weight loss.

## 2020-08-23 LAB — URINALYSIS, ROUTINE W REFLEX MICROSCOPIC
Bilirubin Urine: NEGATIVE
Ketones, ur: NEGATIVE
Leukocytes,Ua: NEGATIVE
Nitrite: NEGATIVE
Specific Gravity, Urine: 1.025 (ref 1.000–1.030)
Total Protein, Urine: NEGATIVE
Urine Glucose: NEGATIVE
Urobilinogen, UA: 1 (ref 0.0–1.0)
pH: 6 (ref 5.0–8.0)

## 2020-08-28 ENCOUNTER — Other Ambulatory Visit: Payer: Self-pay

## 2020-08-28 ENCOUNTER — Ambulatory Visit (INDEPENDENT_AMBULATORY_CARE_PROVIDER_SITE_OTHER): Payer: 59

## 2020-08-28 ENCOUNTER — Other Ambulatory Visit: Payer: 59

## 2020-08-28 DIAGNOSIS — M5441 Lumbago with sciatica, right side: Secondary | ICD-10-CM | POA: Diagnosis not present

## 2020-09-03 ENCOUNTER — Other Ambulatory Visit: Payer: Self-pay | Admitting: Family

## 2020-09-03 DIAGNOSIS — M5441 Lumbago with sciatica, right side: Secondary | ICD-10-CM

## 2020-09-03 NOTE — Progress Notes (Signed)
ref

## 2020-09-11 ENCOUNTER — Ambulatory Visit: Payer: 59 | Admitting: Family Medicine

## 2020-09-18 ENCOUNTER — Other Ambulatory Visit: Payer: Self-pay | Admitting: Family

## 2020-09-18 DIAGNOSIS — E782 Mixed hyperlipidemia: Secondary | ICD-10-CM

## 2020-09-21 ENCOUNTER — Telehealth: Payer: Self-pay

## 2020-09-21 NOTE — Telephone Encounter (Signed)
Patient would like referral to [facility] for Acute right-sided low back pain with right-sided sciatica. Last referral he got a call from an Orthopedist in [place].He does not want to travel. He said that he would prefer an appointment as opposed to walk-in, but does know that is an option.

## 2020-09-21 NOTE — Telephone Encounter (Signed)
Pt called access nurse on 09/21/20 at 7:57am to discuss a specialist appointment. Provided access nurse documentation.

## 2020-09-24 NOTE — Telephone Encounter (Signed)
Rasheedah What is status or ortho referral?  Maralyn Sago,  Please call and check on pt and let him know I am checking on status

## 2020-09-25 NOTE — Telephone Encounter (Signed)
I called to let patient know, but last referral placed for Dr. Ernest Pine. Patient wanted new one placed for [facility].

## 2020-09-25 NOTE — Telephone Encounter (Signed)
I let patient know we were working os this for him.

## 2020-09-25 NOTE — Telephone Encounter (Signed)
Jordan Sims  Can you send ortho referral to [facility]?

## 2020-09-28 NOTE — Telephone Encounter (Signed)
Update on ortho referral?

## 2020-11-06 ENCOUNTER — Other Ambulatory Visit: Payer: Self-pay | Admitting: Family

## 2020-11-06 DIAGNOSIS — I10 Essential (primary) hypertension: Secondary | ICD-10-CM

## 2020-11-15 ENCOUNTER — Telehealth: Payer: Self-pay | Admitting: Family

## 2020-11-15 NOTE — Telephone Encounter (Signed)
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

## 2020-11-15 NOTE — Telephone Encounter (Signed)
I called patient & I have printed him off a copy of his sleep study as well as OV note from when he saw Dr. Belia Heman. He will pick up tomorrow to see if this is what [facility] needs for DOT physical.

## 2020-11-19 NOTE — Telephone Encounter (Signed)
Patient stated that he needing proof of compliance of using CPAP machine. I advised that he call Dr. Clovis Fredrickson office to see if they can provide this since he follows there. I gave patient phone number & he will reach out to them.

## 2020-11-19 NOTE — Telephone Encounter (Signed)
Patient is calling to get more information about his sleep study paperwork.

## 2020-11-20 ENCOUNTER — Telehealth: Payer: Self-pay | Admitting: Internal Medicine

## 2020-11-20 NOTE — Telephone Encounter (Signed)
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 90 day report obtained from [facility]. Called to let patient know that it will be up front to pick up at his convenience. He expressed understanding. Nothing further needed at this time.

## 2020-11-26 ENCOUNTER — Ambulatory Visit: Payer: 59 | Admitting: Family

## 2020-11-26 ENCOUNTER — Other Ambulatory Visit: Payer: Self-pay

## 2020-11-26 ENCOUNTER — Encounter: Payer: Self-pay | Admitting: Family

## 2020-11-26 VITALS — BP 124/80 | HR 88 | Temp 98.6°F | Ht >= 80 in | Wt >= 6400 oz

## 2020-11-26 DIAGNOSIS — I10 Essential (primary) hypertension: Secondary | ICD-10-CM

## 2020-11-26 DIAGNOSIS — E119 Type 2 diabetes mellitus without complications: Secondary | ICD-10-CM | POA: Diagnosis not present

## 2020-11-26 DIAGNOSIS — M5441 Lumbago with sciatica, right side: Secondary | ICD-10-CM

## 2020-11-26 LAB — POCT GLYCOSYLATED HEMOGLOBIN (HGB A1C): Hemoglobin A1C: 6 % — AB (ref 4.0–5.6)

## 2020-11-26 MED ORDER — OZEMPIC (1 MG/DOSE) 4 MG/3ML ~~LOC~~ SOPN: 1.0000 mg | PEN_INJECTOR | SUBCUTANEOUS | 1 refills | Status: DC

## 2020-11-26 NOTE — Assessment & Plan Note (Signed)
Excellent control, continue metformin 500 mg.  We opted to increase Ozempic to 1 mg to further aid in weight loss

## 2020-11-26 NOTE — Assessment & Plan Note (Signed)
Currently following with [facility].  He was seen by them yesterday and prescribed physical therapy and prednisone.  Unable to see office visit notes at this time.

## 2020-11-26 NOTE — Assessment & Plan Note (Signed)
Controlled. Continue losartan 50mg  

## 2020-11-26 NOTE — Progress Notes (Signed)
Subjective:    Patient ID: Jordan Sims, male    DOB: [birth year], 35 y.o.   MRN: [id number]  CC: Jordan Sims is a 35 y.o. male who presents today for follow up.   HPI: Feels well today, no new complaints  DM- metformin 500mg , ozempic 0.5mg . Reports less activity.  Hypertension-compliant with losartan 50 mg.  No chest pain, shortness Recently seen by [facility] for low back pain. He is started with PT and was prescribed prednisone yesterday.   HISTORY:  History reviewed. No pertinent past medical history. Past Surgical History:  Procedure Laterality Date   acl mcl left knee Left    Family History  Problem Relation Age of Onset   Hypertension Father    Thyroid cancer Neg Hx     Allergies: Patient has no known allergies. Current Outpatient Medications on File Prior to Visit  Medication Sig Dispense Refill   cyclobenzaprine (FLEXERIL) 10 MG tablet Take 0.5 tablets (5 mg total) by mouth at bedtime as needed for muscle spasms. DO NOT TAKE WHEN DRIVING 30 tablet 1   losartan (COZAAR) 50 MG tablet Take 1 tablet by mouth once daily 30 tablet 0   meloxicam (MOBIC) 7.5 MG tablet Take 1 tablet (7.5 mg total) by mouth daily as needed for pain. 30 tablet 1   metFORMIN (GLUCOPHAGE-XR) 500 MG 24 hr tablet Take 1 tablet (500 mg total) by mouth at bedtime. 90 tablet 3   rosuvastatin (CRESTOR) 10 MG tablet Take 1 tablet by mouth once daily 90 tablet 0   No current facility-administered medications on file prior to visit.    Social History   Tobacco Use   Smoking status: Light Smoker   Smokeless tobacco: Never   Tobacco comments:    Ocassionally  Vaping Use   Vaping Use: Never used  Substance Use Topics   Alcohol use: Yes   Drug use: No    Review of Systems  Constitutional:  Negative for chills and fever.  Respiratory:  Negative for cough.   Cardiovascular:  Negative for chest pain and palpitations.  Gastrointestinal:  Negative for nausea and vomiting.  Musculoskeletal:   Positive for back pain.     Objective:    BP 124/80 (BP Location: Left Arm, Patient Position: Sitting, Cuff Size: Large)   Pulse 88   Temp 98.6 F (37 C) (Oral)   Ht 6\' 8"  (2.032 m)   Wt (!) 418 lb (189.6 kg)   SpO2 97%   BMI 45.92 kg/m  BP Readings from Last 3 Encounters:  11/26/20 124/80  08/22/20 110/80  05/25/20 122/70   Wt Readings from Last 3 Encounters:  11/26/20 (!) 418 lb (189.6 kg)  08/22/20 (!) 413 lb 12.8 oz (187.7 kg)  05/25/20 (!) 423 lb 3.2 oz (192 kg)    Physical Exam Vitals reviewed.  Constitutional:      Appearance: He is well-developed.  Cardiovascular:     Rate and Rhythm: Regular rhythm.     Heart sounds: Normal heart sounds.  Pulmonary:     Effort: Pulmonary effort is normal. No respiratory distress.     Breath sounds: Normal breath sounds. No wheezing, rhonchi or rales.  Skin:    General: Skin is warm and dry.  Neurological:     Mental Status: He is alert.  Psychiatric:        Speech: Speech normal.        Behavior: Behavior normal.       Assessment & Plan:   Problem List  Items Addressed This Visit       Cardiovascular and Mediastinum   Essential hypertension    Controlled. Continue losartan 50mg .         Endocrine   DM (diabetes mellitus) (HCC) - Primary    Excellent control, continue metformin 500 mg.  We opted to increase Ozempic to 1 mg to further aid in weight loss      Relevant Medications   Semaglutide, 1 MG/DOSE, (OZEMPIC, 1 MG/DOSE,) 4 MG/3ML SOPN   Other Relevant Orders   POCT HgB A1C     Other   Low back pain    Currently following with [facility].  He was seen by them yesterday and prescribed physical therapy and prednisone.  Unable to see office visit notes at this time.        I have discontinued Rich Paprocki. Stabler's Ozempic (0.25 or 0.5 MG/DOSE). I am also having him start on Ozempic (1 MG/DOSE). Additionally, I am having him maintain his metFORMIN, cyclobenzaprine, meloxicam, rosuvastatin, and  losartan.   Meds ordered this encounter  Medications   Semaglutide, 1 MG/DOSE, (OZEMPIC, 1 MG/DOSE,) 4 MG/3ML SOPN    Sig: Inject 1 mg into the skin once a week.    Dispense:  9 mL    Refill:  1    Order Specific Question:   Supervising Provider    Answer:   Madelyn Brunner [[id number]]     Return precautions given.   Risks, benefits, and alternatives of the medications and treatment plan prescribed today were discussed, and patient expressed understanding.   Education regarding symptom management and diagnosis given to patient on AVS.  Continue to follow with [name], FNP for routine health maintenance.   Allegra Grana and I agreed with plan.   Cletus Gash, FNP

## 2020-12-14 ENCOUNTER — Other Ambulatory Visit: Payer: Self-pay | Admitting: Family

## 2020-12-14 DIAGNOSIS — I10 Essential (primary) hypertension: Secondary | ICD-10-CM

## 2020-12-30 ENCOUNTER — Other Ambulatory Visit: Payer: Self-pay | Admitting: Family

## 2020-12-30 DIAGNOSIS — E782 Mixed hyperlipidemia: Secondary | ICD-10-CM

## 2021-02-04 ENCOUNTER — Other Ambulatory Visit: Payer: Self-pay | Admitting: Family

## 2021-02-04 DIAGNOSIS — I10 Essential (primary) hypertension: Secondary | ICD-10-CM

## 2021-02-18 ENCOUNTER — Telehealth: Payer: Self-pay | Admitting: Internal Medicine

## 2021-02-18 NOTE — Telephone Encounter (Signed)
Compliance report has bene printed and placed up front for pickup. Patient is aware and voiced his understanding.  Nothing further needed at this time.

## 2021-02-27 ENCOUNTER — Other Ambulatory Visit (HOSPITAL_COMMUNITY): Payer: Self-pay

## 2021-02-27 ENCOUNTER — Other Ambulatory Visit: Payer: Self-pay

## 2021-02-27 ENCOUNTER — Ambulatory Visit: Payer: 59 | Admitting: Family

## 2021-02-27 ENCOUNTER — Encounter: Payer: Self-pay | Admitting: Family

## 2021-02-27 VITALS — BP 110/76 | HR 98 | Temp 98.5°F | Ht >= 80 in | Wt >= 6400 oz

## 2021-02-27 DIAGNOSIS — E119 Type 2 diabetes mellitus without complications: Secondary | ICD-10-CM

## 2021-02-27 DIAGNOSIS — E782 Mixed hyperlipidemia: Secondary | ICD-10-CM

## 2021-02-27 DIAGNOSIS — I10 Essential (primary) hypertension: Secondary | ICD-10-CM | POA: Diagnosis not present

## 2021-02-27 LAB — POCT GLYCOSYLATED HEMOGLOBIN (HGB A1C): Hemoglobin A1C: 6 % — AB (ref 4.0–5.6)

## 2021-02-27 MED ORDER — SEMAGLUTIDE (2 MG/DOSE) 8 MG/3ML ~~LOC~~ SOPN
2.0000 mg | PEN_INJECTOR | SUBCUTANEOUS | 1 refills | Status: DC
  Filled 2021-02-27: qty 3, 28d supply, fill #0

## 2021-02-27 MED ORDER — SEMAGLUTIDE (2 MG/DOSE) 8 MG/3ML ~~LOC~~ SOPN
2.0000 mg | PEN_INJECTOR | SUBCUTANEOUS | 1 refills | Status: DC
Start: 2021-02-27 — End: 2021-02-27

## 2021-02-27 MED ORDER — ROSUVASTATIN CALCIUM 10 MG PO TABS: 10.0000 mg | ORAL_TABLET | Freq: Every day | ORAL | 2 refills | Status: DC

## 2021-02-27 NOTE — Patient Instructions (Addendum)
I have increased Ozempic from 1 mg to 2 mg.  This dose has been somewhat difficult to find.  If you are unable to find the 2 mg dose, please call let us know.  This is  Dr. Melina Schools  example of a  "Low GI"  Diet:  It will allow you to lose 4 to 8  lbs  per month if you follow it carefully.  Your goal with exercise is a minimum of 30 minutes of aerobic exercise 5 days per week (Walking does not count once it becomes easy!)    All of the foods can be found at grocery stores and in bulk at [employer].  The Atkins protein bars and shakes are available in more varieties at Target, [employer] and [employer]'s Foods.     7 AM Breakfast:  Choose from the following:  Low carbohydrate Protein  Shakes (I recommend the  Premier Protein chocolate shakes,  EAS AdvantEdge "Carb Control" shakes  Or the Atkins shakes all are under 3 net carbs)     a scrambled egg/bacon/cheese burrito made with Mission's "carb balance" whole wheat tortilla  (about 10 net carbs )  Medical laboratory scientific officer (basically a quiche without the pastry crust) that is eaten cold and very convenient way to get your eggs.  8 carbs)  If you make your own protein shakes, avoid bananas and pineapple,  And use low carb greek yogurt or original /unsweetened almond or soy milk    Avoid cereal and bananas, oatmeal and cream of wheat and grits. They are loaded with carbohydrates!   10 AM: high protein snack:  Protein bar by Atkins (the snack size, under 200 cal, usually < 6 net carbs).    A stick of cheese:  Around 1 carb,  100 cal     Dannon Light n Fit [place] Yogurt  (80 cal, 8 carbs)  Other so called "protein bars" and Greek yogurts tend to be loaded with carbohydrates.  Remember, in food advertising, the word "energy" is synonymous for " carbohydrate."  Lunch:   A Sandwich using the bread choices listed, Can use any  Eggs,  lunchmeat, grilled meat or canned tuna), avocado, regular mayo/mustard  and cheese.  A Salad using blue cheese,  ranch,  Goddess or vinagrette,  Avoid taco shells, croutons or "confetti" and no "candied nuts" but regular nuts OK.   No pretzels, nabs  or chips.  Pickles and miniature sweet peppers are a good low carb alternative that provide a "crunch"  The bread is the only source of carbohydrate in a sandwich and  can be decreased by trying some of the attached alternatives to traditional loaf bread   Avoid "Low fat dressings, as well as Reyne Dumas and [employer] dressings They are loaded with sugar!   3 PM/ Mid day  Snack:  Consider  1 ounce of  almonds, walnuts, pistachios, pecans, peanuts,  Macadamia nuts or a nut medley.  Avoid "granola and granola bars "  Mixed nuts are ok in moderation as long as there are no raisins,  cranberries or dried fruit.   KIND bars are OK if you get the low glycemic index variety   Try the prosciutto/mozzarella cheese sticks by Fiorruci  In deli /backery section   High protein      6 PM  Dinner:     Meat/fowl/fish with a green salad, and either broccoli, cauliflower, green beans, spinach, brussel sprouts or  Lima beans. DO NOT BREAD THE PROTEIN!!  There is a low carb pasta by Dreamfield's that is acceptable and tastes great: only 5 digestible carbs/serving.( All grocery stores but BJs carry it ) Several ready made meals are available low carb:   Try Michel Angelo's chicken piccata or chicken or eggplant parm over low carb pasta.(Lowes and BJs)   Clifton Custard Sanchez's "Carnitas" (pulled pork, no sauce,  0 carbs) or his beef pot roast to make a dinner burrito (at BJ's)  Pesto over low carb pasta (bj's sells a good quality pesto in the center refrigerated section of the deli   Try satueeing  Roosvelt Harps with mushroooms as a good side   [employer] Giant makes a mashed cauliflower that tastes like mashed potatoes  Whole wheat pasta is still full of digestible carbs and  Not as low in glycemic index as Dreamfield's.   Brown rice is still rice,  So skip the rice and noodles if you  eat [place] or [place] (or at least limit to 1/2 cup)  9 PM snack :   Breyer's "low carb" fudgsicle or  ice cream bar (Carb Smart line), or  Weight Watcher's ice cream bar , or another "no sugar added" ice cream;  a serving of fresh berries/cherries with whipped cream   Cheese or DANNON'S LlGHT N FIT GREEK YOGURT  8 ounces of [place] unsweetened almond/cococunut milk    Treat yourself to a parfait made with whipped cream blueberiies, walnuts and vanilla greek yogurt  Avoid bananas, pineapple, grapes  and watermelon on a regular basis because they are high in sugar.  THINK OF THEM AS DESSERT  Remember that snack Substitutions should be less than 10 NET carbs per serving and meals < 20 carbs. Remember to subtract fiber grams to get the "net carbs."  Carbohydrate Counting for Diabetes Mellitus, Adult Carbohydrate counting is a method of keeping track of how many carbohydrates you eat. Eating carbohydrates increases the amount of sugar (glucose) in the blood. Counting how many carbohydrates you eat improves how well you manage your blood glucose. This, in turn, helps you manage your diabetes. Carbohydrates are measured in grams (g) per serving. It is important to know how many carbohydrates (in grams or by serving size) you can have in each meal. This is different for every person. A dietitian can help you make a meal plan and calculate how many carbohydrates you should have at each meal and snack. What foods contain carbohydrates? Carbohydrates are found in the following foods: Grains, such as breads and cereals. Dried beans and soy products. Starchy vegetables, such as potatoes, peas, and corn. Fruit and fruit juices. Milk and yogurt. Sweets and snack foods, such as cake, cookies, candy, chips, and soft drinks. How do I count carbohydrates in foods? There are two ways to count carbohydrates in food. You can read food labels or learn standard serving sizes of foods. You can use either of  these methods or a combination of both. Using the Nutrition Facts label The Nutrition Facts list is included on the labels of almost all packaged foods and beverages in the [place]. It includes: The serving size. Information about nutrients in each serving, including the grams of carbohydrate per serving. To use the Nutrition Facts, decide how many servings you will have. Then, multiply the number of servings by the number of carbohydrates per serving. The resulting number is the total grams of carbohydrates that you will be having. Learning the standard serving sizes of foods When you eat carbohydrate foods that are  not packaged or do not include Nutrition Facts on the label, you need to measure the servings in order to count the grams of carbohydrates. Measure the foods that you will eat with a food scale or measuring cup, if needed. Decide how many standard-size servings you will eat. Multiply the number of servings by 15. For foods that contain carbohydrates, one serving equals 15 g of carbohydrates. For example, if you eat 2 cups or 10 oz (300 g) of strawberries, you will have eaten 2 servings and 30 g of carbohydrates (2 servings x 15 g = 30 g). For foods that have more than one food mixed, such as soups and casseroles, you must count the carbohydrates in each food that is included. The following list contains standard serving sizes of common carbohydrate-rich foods. Each of these servings has about 15 g of carbohydrates: 1 slice of bread. 1 six-inch (15 cm) tortilla. ? cup or 2 oz (53 g) cooked rice or pasta.  cup or 3 oz (85 g) cooked or canned, drained and rinsed beans or lentils.  cup or 3 oz (85 g) starchy vegetable, such as peas, corn, or squash.  cup or 4 oz (120 g) hot cereal.  cup or 3 oz (85 g) boiled or mashed potatoes, or  or 3 oz (85 g) of a large baked potato.  cup or 4 fl oz (118 mL) fruit juice. 1 cup or 8 fl oz (237 mL) milk. 1 small or 4 oz (106 g) apple.   or 2 oz (63 g) of a medium banana. 1 cup or 5 oz (150 g) strawberries. 3 cups or 1 oz (28.3 g) popped popcorn. What is an example of carbohydrate counting? To calculate the grams of carbohydrates in this sample meal, follow the steps shown below. Sample meal 3 oz (85 g) chicken breast. ? cup or 4 oz (106 g) brown rice.  cup or 3 oz (85 g) corn. 1 cup or 8 fl oz (237 mL) milk. 1 cup or 5 oz (150 g) strawberries with sugar-free whipped topping. Carbohydrate calculation Identify the foods that contain carbohydrates: Rice. Corn. Milk. Strawberries. Calculate how many servings you have of each food: 2 servings rice. 1 serving corn. 1 serving milk. 1 serving strawberries. Multiply each number of servings by 15 g: 2 servings rice x 15 g = 30 g. 1 serving corn x 15 g = 15 g. 1 serving milk x 15 g = 15 g. 1 serving strawberries x 15 g = 15 g. Add together all of the amounts to find the total grams of carbohydrates eaten: 30 g + 15 g + 15 g + 15 g = 75 g of carbohydrates total. What are tips for following this plan? Shopping Develop a meal plan and then make a shopping list. Buy fresh and frozen vegetables, fresh and frozen fruit, dairy, eggs, beans, lentils, and whole grains. Look at food labels. Choose foods that have more fiber and less sugar. Avoid processed foods and foods with added sugars. Meal planning Aim to have the same number of grams of carbohydrates at each meal and for each snack time. Plan to have regular, balanced meals and snacks. Where to find more information American Diabetes Association: diabetes.org Centers for Disease Control and Prevention: TonerPromos.no Academy of Nutrition and Dietetics: eatright.org Association of Diabetes Care & Education Specialists: diabeteseducator.org Summary Carbohydrate counting is a method of keeping track of how many carbohydrates you eat. Eating carbohydrates increases the amount of sugar (glucose) in your blood.  Counting how many  carbohydrates you eat improves how well you manage your blood glucose. This helps you manage your diabetes. A dietitian can help you make a meal plan and calculate how many carbohydrates you should have at each meal and snack. This information is not intended to replace advice given to you by your health care provider. Make sure you discuss any questions you have with your health care provider. Document Revised: 11/02/2019 Document Reviewed: 11/02/2019 Elsevier Patient Education  2022 [employer].

## 2021-02-27 NOTE — Assessment & Plan Note (Addendum)
Chronic, stable.  Continue  metformin 500 mg.  We agreed to increase Ozempic to 2 mg for additional benefit of weight loss.  Patient will let me know how he is doing

## 2021-02-27 NOTE — Progress Notes (Signed)
Subjective:    Patient ID: Jordan Sims, male    DOB: [birth year], 35 y.o.   MRN: [id number]  CC: Jordan Sims is a 35 y.o. male who presents today for follow up.   HPI: Feels well.  No new complaints.  He is not drinking sugary drinks.  For lunch and dinner, usually has  high-protein frozen meal, approximately 53 carbs.  He can tell his appetite is less on Ozempic.  No weight loss however.  He stays active as a Mudlogger. HTN- compliant with losartan 50mg . No cp , sob DM-compliant with Ozempic 1 mg, metformin 500 mg. HISTORY:  No past medical history on file. Past Surgical History:  Procedure Laterality Date   acl mcl left knee Left    Family History  Problem Relation Age of Onset   Hypertension Father    Thyroid cancer Neg Hx     Allergies: Patient has no known allergies. Current Outpatient Medications on File Prior to Visit  Medication Sig Dispense Refill   cyclobenzaprine (FLEXERIL) 10 MG tablet Take 0.5 tablets (5 mg total) by mouth at bedtime as needed for muscle spasms. DO NOT TAKE WHEN DRIVING 30 tablet 1   losartan (COZAAR) 50 MG tablet Take 1 tablet by mouth once daily 30 tablet 0   meloxicam (MOBIC) 7.5 MG tablet Take 1 tablet (7.5 mg total) by mouth daily as needed for pain. 30 tablet 1   metFORMIN (GLUCOPHAGE-XR) 500 MG 24 hr tablet Take 1 tablet (500 mg total) by mouth at bedtime. 90 tablet 3   No current facility-administered medications on file prior to visit.    Social History   Tobacco Use   Smoking status: Light Smoker   Smokeless tobacco: Never   Tobacco comments:    Ocassionally  Vaping Use   Vaping Use: Never used  Substance Use Topics   Alcohol use: Yes   Drug use: No    Review of Systems  Constitutional:  Negative for chills and fever.  Respiratory:  Negative for cough.   Cardiovascular:  Negative for chest pain and palpitations.  Gastrointestinal:  Negative for nausea and vomiting.     Objective:    BP 110/76 (BP Location:  Left Arm, Patient Position: Sitting, Cuff Size: Large)   Pulse 98   Temp 98.5 F (36.9 C) (Oral)   Ht 6\' 8"  (2.032 m)   Wt (!) 419 lb (190.1 kg)   SpO2 96%   BMI 46.03 kg/m  BP Readings from Last 3 Encounters:  02/27/21 110/76  11/26/20 124/80  08/22/20 110/80   Wt Readings from Last 3 Encounters:  02/27/21 (!) 419 lb (190.1 kg)  11/26/20 (!) 418 lb (189.6 kg)  08/22/20 (!) 413 lb 12.8 oz (187.7 kg)    Physical Exam Vitals reviewed.  Constitutional:      Appearance: He is well-developed.  Cardiovascular:     Rate and Rhythm: Regular rhythm.     Heart sounds: Normal heart sounds.  Pulmonary:     Effort: Pulmonary effort is normal. No respiratory distress.     Breath sounds: Normal breath sounds. No wheezing, rhonchi or rales.  Skin:    General: Skin is warm and dry.  Neurological:     Mental Status: He is alert.  Psychiatric:        Speech: Speech normal.        Behavior: Behavior normal.       Assessment & Plan:   Problem List Items Addressed This Visit  Cardiovascular and Mediastinum   Essential hypertension    Stable, controlled.  Continue losartan 50mg       Relevant Medications   rosuvastatin (CRESTOR) 10 MG tablet     Endocrine   DM (diabetes mellitus) (HCC) - Primary    Chronic, stable.  Continue  metformin 500 mg.  We agreed to increase Ozempic to 2 mg for additional benefit of weight loss.  Patient will let me know how he is doing      Relevant Medications   rosuvastatin (CRESTOR) 10 MG tablet   Semaglutide, 2 MG/DOSE, 8 MG/3ML SOPN   Other Relevant Orders   POCT HgB A1C (Completed)   Comprehensive metabolic panel     Other   HLD (hyperlipidemia)   Relevant Medications   rosuvastatin (CRESTOR) 10 MG tablet     I have discontinued E. Dockendorf's Ozempic (1 MG/DOSE). I have also changed his rosuvastatin. Additionally, I am having him maintain his metFORMIN, cyclobenzaprine, meloxicam, losartan, and Semaglutide (2 MG/DOSE).   Meds  ordered this encounter  Medications   DISCONTD: Semaglutide, 2 MG/DOSE, 8 MG/3ML SOPN    Sig: Inject 2 mg as directed once a week.    Dispense:  3 mL    Refill:  1    Order Specific Question:   Supervising Provider    Answer:   Marcello Moores L [[id number]]   rosuvastatin (CRESTOR) 10 MG tablet    Sig: Take 1 tablet (10 mg total) by mouth daily.    Dispense:  90 tablet    Refill:  2    Order Specific Question:   Supervising Provider    Answer:   Duncan Dull L [[id number]]   Semaglutide, 2 MG/DOSE, 8 MG/3ML SOPN    Sig: Inject 2 mg as directed once a week.    Dispense:  3 mL    Refill:  1    Order Specific Question:   Supervising Provider    Answer:   Duncan Dull [[id number]]    Return precautions given.   Risks, benefits, and alternatives of the medications and treatment plan prescribed today were discussed, and patient expressed understanding.   Education regarding symptom management and diagnosis given to patient on AVS.  Continue to follow with [name], FNP for routine health maintenance.   Allegra Grana and I agreed with plan.   Cletus Gash, FNP

## 2021-02-27 NOTE — Assessment & Plan Note (Signed)
Stable, controlled.  Continue losartan 50mg 

## 2021-02-28 LAB — COMPREHENSIVE METABOLIC PANEL
ALT: 30 U/L (ref 0–53)
AST: 23 U/L (ref 0–37)
Albumin: 4.6 g/dL (ref 3.5–5.2)
Alkaline Phosphatase: 61 U/L (ref 39–117)
BUN: 13 mg/dL (ref 6–23)
CO2: 25 mEq/L (ref 19–32)
Calcium: 9.9 mg/dL (ref 8.4–10.5)
Chloride: 103 mEq/L (ref 96–112)
Creatinine, Ser: 0.95 mg/dL (ref 0.40–1.50)
GFR: 102.8 mL/min (ref >60.00)
Glucose, Bld: 94 mg/dL (ref 70–99)
Potassium: 4.1 mEq/L (ref 3.5–5.1)
Sodium: 137 mEq/L (ref 135–145)
Total Bilirubin: 0.3 mg/dL (ref 0.2–1.2)
Total Protein: 7.4 g/dL (ref 6.0–8.3)

## 2021-03-13 ENCOUNTER — Other Ambulatory Visit: Payer: Self-pay | Admitting: Family

## 2021-03-13 DIAGNOSIS — I10 Essential (primary) hypertension: Secondary | ICD-10-CM

## 2021-04-16 ENCOUNTER — Other Ambulatory Visit: Payer: Self-pay | Admitting: Family

## 2021-04-16 DIAGNOSIS — Z6841 Body Mass Index (BMI) 40.0 and over, adult: Secondary | ICD-10-CM

## 2021-04-16 DIAGNOSIS — E119 Type 2 diabetes mellitus without complications: Secondary | ICD-10-CM

## 2021-04-16 DIAGNOSIS — I10 Essential (primary) hypertension: Secondary | ICD-10-CM

## 2021-06-28 ENCOUNTER — Ambulatory Visit: Payer: 59 | Admitting: Family

## 2021-06-28 ENCOUNTER — Encounter: Payer: Self-pay | Admitting: Family

## 2021-06-28 ENCOUNTER — Other Ambulatory Visit: Payer: Self-pay

## 2021-06-28 VITALS — BP 130/82 | HR 102 | Temp 98.3°F | Ht >= 80 in | Wt >= 6400 oz

## 2021-06-28 DIAGNOSIS — Z6841 Body Mass Index (BMI) 40.0 and over, adult: Secondary | ICD-10-CM

## 2021-06-28 DIAGNOSIS — E785 Hyperlipidemia, unspecified: Secondary | ICD-10-CM | POA: Diagnosis not present

## 2021-06-28 DIAGNOSIS — Z79899 Other long term (current) drug therapy: Secondary | ICD-10-CM

## 2021-06-28 DIAGNOSIS — Z125 Encounter for screening for malignant neoplasm of prostate: Secondary | ICD-10-CM | POA: Diagnosis not present

## 2021-06-28 DIAGNOSIS — E782 Mixed hyperlipidemia: Secondary | ICD-10-CM

## 2021-06-28 DIAGNOSIS — I10 Essential (primary) hypertension: Secondary | ICD-10-CM | POA: Diagnosis not present

## 2021-06-28 DIAGNOSIS — E119 Type 2 diabetes mellitus without complications: Secondary | ICD-10-CM | POA: Diagnosis not present

## 2021-06-28 MED ORDER — METFORMIN HCL ER 500 MG PO TB24: 500.0000 mg | ORAL_TABLET | Freq: Every day | ORAL | 3 refills | Status: DC

## 2021-06-28 MED ORDER — ROSUVASTATIN CALCIUM 10 MG PO TABS: 10.0000 mg | ORAL_TABLET | Freq: Every day | ORAL | 2 refills | Status: DC

## 2021-06-28 MED ORDER — LOSARTAN POTASSIUM 50 MG PO TABS: 50.0000 mg | ORAL_TABLET | Freq: Every day | ORAL | 3 refills | Status: DC

## 2021-06-28 NOTE — Assessment & Plan Note (Signed)
Chronic, stable.  Continue Crestor 10 mg 

## 2021-06-28 NOTE — Addendum Note (Signed)
Addended by: Neta Ehlers on: 06/28/2021 03:30 PM ? ? Modules accepted: Orders ? ?

## 2021-06-28 NOTE — Assessment & Plan Note (Signed)
Chronic stable.  Continue losartan 50 mg ?

## 2021-06-28 NOTE — Progress Notes (Signed)
? ?Subjective:  ? ? Patient ID: Jordan Sims, male    DOB: Nov 02, 1985, 36 y.o.   MRN: [id number] ? ?CC: Jordan Sims is a 36 y.o. male who presents today for follow up.  ? ?HPI: Feels well today ?No new complaints ? ?He has began playing basketball again. No cp, sob.  ?Hyperlipidemia-compliant with Crestor 10 mg ?Diabetes-compliant metformin 500 mg, Ozempic 2 mg.  Denies nausea, constipation. He has lost 12 lbs.  ? ?Hypertension-compliant with losartan 50 mg ? ?HISTORY:  ?History reviewed. No pertinent past medical history. ?Past Surgical History:  ?Procedure Laterality Date  ? acl mcl left knee Left   ? ?Family History  ?Problem Relation Age of Onset  ? Hypertension Father   ? Thyroid cancer Neg Hx   ? ? ?Allergies: Patient has no known allergies. ?Current Outpatient Medications on File Prior to Visit  ?Medication Sig Dispense Refill  ? meloxicam (MOBIC) 7.5 MG tablet Take 1 tablet (7.5 mg total) by mouth daily as needed for pain. 30 tablet 1  ? Semaglutide, 2 MG/DOSE, 8 MG/3ML SOPN Inject 2 mg as directed once a week. 3 mL 1  ? ?No current facility-administered medications on file prior to visit.  ? ? ?Social History  ? ?Tobacco Use  ? Smoking status: Light Smoker  ? Smokeless tobacco: Never  ? Tobacco comments:  ?  Ocassionally  ?Vaping Use  ? Vaping Use: Never used  ?Substance Use Topics  ? Alcohol use: Yes  ? Drug use: No  ? ? ?Review of Systems  ?Constitutional:  Negative for chills and fever.  ?HENT:  Negative for congestion, ear pain, rhinorrhea, sinus pressure and sore throat.   ?Respiratory:  Negative for cough, shortness of breath and wheezing.   ?Cardiovascular:  Negative for chest pain and palpitations.  ?Gastrointestinal:  Negative for diarrhea, nausea and vomiting.  ?Genitourinary:  Negative for dysuria.  ?Musculoskeletal:  Negative for myalgias.  ?Skin:  Negative for rash.  ?Neurological:  Negative for headaches.  ?Hematological:  Negative for adenopathy.  ?   ?Objective:  ?  ?BP 130/82 (BP  Location: Left Arm, Patient Position: Sitting, Cuff Size: Large)   Pulse (!) 102   Temp 98.3 ?F (36.8 ?C) (Oral)   Ht 6' 8" (2.032 m)   Wt (!) 407 lb 9.6 oz (184.9 kg)   SpO2 96%   BMI 44.78 kg/m?  ?BP Readings from Last 3 Encounters:  ?06/28/21 130/82  ?02/27/21 110/76  ?11/26/20 124/80  ? ?Wt Readings from Last 3 Encounters:  ?06/28/21 (!) 407 lb 9.6 oz (184.9 kg)  ?02/27/21 (!) 419 lb (190.1 kg)  ?11/26/20 (!) 418 lb (189.6 kg)  ? ? ?Physical Exam ?Vitals reviewed.  ?Constitutional:   ?   Appearance: He is well-developed.  ?Cardiovascular:  ?   Rate and Rhythm: Regular rhythm.  ?   Heart sounds: Normal heart sounds.  ?Pulmonary:  ?   Effort: Pulmonary effort is normal. No respiratory distress.  ?   Breath sounds: Normal breath sounds. No wheezing, rhonchi or rales.  ?Skin: ?   General: Skin is warm and dry.  ?Neurological:  ?   Mental Status: He is alert.  ?Psychiatric:     ?   Speech: Speech normal.     ?   Behavior: Behavior normal.  ? ? ?   ?Assessment & Plan:  ? ?Problem List Items Addressed This Visit   ? ?  ? Cardiovascular and Mediastinum  ? Essential hypertension  ?  Chronic stable.  Continue losartan 50 mg ?  ?  ? Relevant Medications  ? losartan (COZAAR) 50 MG tablet  ? rosuvastatin (CRESTOR) 10 MG tablet  ? Other Relevant Orders  ? Comp Met (CMET)  ?  ? Endocrine  ? DM (diabetes mellitus) ([place]) - Primary  ?  Anticipate improved.  Pending A1c.  Continue metformin 500 mg, Ozempic 2 mg ?  ?  ? Relevant Medications  ? losartan (COZAAR) 50 MG tablet  ? rosuvastatin (CRESTOR) 10 MG tablet  ? metFORMIN (GLUCOPHAGE-XR) 500 MG 24 hr tablet  ? Other Relevant Orders  ? Comp Met (CMET)  ? HgB A1c  ? VITAMIN D 25 Hydroxy (Vit-D Deficiency, Fractures)  ? Microalbumin / creatinine urine ratio  ?  ? Other  ? HLD (hyperlipidemia)  ?  Chronic, stable.  Continue Crestor 10 mg ?  ?  ? Relevant Medications  ? losartan (COZAAR) 50 MG tablet  ? rosuvastatin (CRESTOR) 10 MG tablet  ? Other Relevant Orders  ? Lipid panel   ? Morbid obesity with BMI of 45.0-49.9, adult ([place])  ? Relevant Medications  ? metFORMIN (GLUCOPHAGE-XR) 500 MG 24 hr tablet  ? ?Other Visit Diagnoses   ? ? Prostate cancer screening      ? Relevant Orders  ? PSA  ? Long-term use of high-risk medication      ? Relevant Orders  ? TSH  ? CBC w/Diff  ? ?  ? ? ? ?I have discontinued Jazion Atteberry. Tones's cyclobenzaprine. I have also changed his losartan and metFORMIN. Additionally, I am having him maintain his meloxicam, Semaglutide (2 MG/DOSE), and rosuvastatin. ? ? ?Meds ordered this encounter  ?Medications  ? losartan (COZAAR) 50 MG tablet  ?  Sig: Take 1 tablet (50 mg total) by mouth daily.  ?  Dispense:  90 tablet  ?  Refill:  3  ?  Order Specific Question:   Supervising Provider  ?  Answer:   Crecencio Mc [[id number]]  ? rosuvastatin (CRESTOR) 10 MG tablet  ?  Sig: Take 1 tablet (10 mg total) by mouth daily.  ?  Dispense:  90 tablet  ?  Refill:  2  ?  Order Specific Question:   Supervising Provider  ?  Answer:   Crecencio Mc [[id number]]  ? metFORMIN (GLUCOPHAGE-XR) 500 MG 24 hr tablet  ?  Sig: Take 1 tablet (500 mg total) by mouth at bedtime.  ?  Dispense:  90 tablet  ?  Refill:  3  ?  Order Specific Question:   Supervising Provider  ?  Answer:   Crecencio Mc [[id number]]  ? ? ?Return precautions given.  ? ?Risks, benefits, and alternatives of the medications and treatment plan prescribed today were discussed, and patient expressed understanding.  ? ?Education regarding symptom management and diagnosis given to patient on AVS. ? ?Continue to follow with Burnard Hawthorne, FNP for routine health maintenance.  ? ?Drema Dallas and I agreed with plan.  ? ?Mable Paris, FNP ? ? ?

## 2021-06-28 NOTE — Assessment & Plan Note (Signed)
Anticipate improved.  Pending A1c.  Continue metformin 500 mg, Ozempic 2 mg ?

## 2021-06-29 LAB — COMPREHENSIVE METABOLIC PANEL
AG Ratio: 1.4 (calc) (ref 1.0–2.5)
ALT: 32 U/L (ref 9–46)
AST: 24 U/L (ref 10–40)
Albumin: 4.2 g/dL (ref 3.6–5.1)
Alkaline phosphatase (APISO): 67 U/L (ref 36–130)
BUN: 10 mg/dL (ref 7–25)
CO2: 21 mmol/L (ref 20–32)
Calcium: 9.9 mg/dL (ref 8.6–10.3)
Chloride: 106 mmol/L (ref 98–110)
Creat: 0.9 mg/dL (ref 0.60–1.26)
Globulin: 3 g/dL (calc) (ref 1.9–3.7)
Glucose, Bld: 98 mg/dL (ref 65–99)
Potassium: 4.2 mmol/L (ref 3.5–5.3)
Sodium: 141 mmol/L (ref 135–146)
Total Bilirubin: 0.4 mg/dL (ref 0.2–1.2)
Total Protein: 7.2 g/dL (ref 6.1–8.1)

## 2021-06-29 LAB — VITAMIN D 25 HYDROXY (VIT D DEFICIENCY, FRACTURES): Vit D, 25-Hydroxy: 20 ng/mL — ABNORMAL LOW (ref 30–100)

## 2021-06-29 LAB — LIPID PANEL
Cholesterol: 130 mg/dL (ref <200)
HDL: 34 mg/dL — ABNORMAL LOW (ref >=40)
LDL Cholesterol (Calc): 73 mg/dL (calc)
Non-HDL Cholesterol (Calc): 96 mg/dL (calc) (ref <130)
Total CHOL/HDL Ratio: 3.8 (calc) (ref <5.0)
Triglycerides: 151 mg/dL — ABNORMAL HIGH (ref <150)

## 2021-06-29 LAB — CBC WITH DIFFERENTIAL/PLATELET
Absolute Monocytes: 843 cells/uL (ref 200–950)
Basophils Absolute: 17 cells/uL (ref 0–200)
Basophils Relative: 0.2 %
Eosinophils Absolute: 215 cells/uL (ref 15–500)
Eosinophils Relative: 2.5 %
HCT: 47.3 % (ref 38.5–50.0)
Hemoglobin: 15.5 g/dL (ref 13.2–17.1)
Lymphs Abs: 2752 cells/uL (ref 850–3900)
MCH: 28.2 pg (ref 27.0–33.0)
MCHC: 32.8 g/dL (ref 32.0–36.0)
MCV: 86.2 fL (ref 80.0–100.0)
MPV: 11 fL (ref 7.5–12.5)
Monocytes Relative: 9.8 %
Neutro Abs: 4773 cells/uL (ref 1500–7800)
Neutrophils Relative %: 55.5 %
Platelets: 267 10*3/uL (ref 140–400)
RBC: 5.49 10*6/uL (ref 4.20–5.80)
RDW: 13.4 % (ref 11.0–15.0)
Total Lymphocyte: 32 %
WBC: 8.6 10*3/uL (ref 3.8–10.8)

## 2021-06-29 LAB — PSA: PSA: 0.35 ng/mL (ref <=4.00)

## 2021-06-29 LAB — MICROALBUMIN / CREATININE URINE RATIO
Creatinine, Urine: 128 mg/dL (ref 20–320)
Microalb Creat Ratio: 9 mcg/mg creat (ref <30)
Microalb, Ur: 1.2 mg/dL

## 2021-06-29 LAB — HEMOGLOBIN A1C
Hgb A1c MFr Bld: 5.9 % of total Hgb — ABNORMAL HIGH (ref <5.7)
Mean Plasma Glucose: 123 mg/dL
eAG (mmol/L): 6.8 mmol/L

## 2021-06-29 LAB — TSH: TSH: 1.34 mIU/L (ref 0.40–4.50)

## 2021-07-17 ENCOUNTER — Telehealth: Payer: Self-pay

## 2021-08-29 NOTE — Telephone Encounter (Signed)
Error

## 2021-12-30 ENCOUNTER — Ambulatory Visit: Payer: 59 | Admitting: Family

## 2022-01-15 ENCOUNTER — Encounter: Payer: Self-pay | Admitting: Family

## 2022-01-15 ENCOUNTER — Ambulatory Visit: Payer: 59 | Admitting: Family

## 2022-01-15 VITALS — BP 136/82 | HR 92 | Temp 98.4°F | Ht >= 80 in | Wt >= 6400 oz

## 2022-01-15 DIAGNOSIS — E119 Type 2 diabetes mellitus without complications: Secondary | ICD-10-CM

## 2022-01-15 DIAGNOSIS — Z6841 Body Mass Index (BMI) 40.0 and over, adult: Secondary | ICD-10-CM

## 2022-01-15 DIAGNOSIS — M5441 Lumbago with sciatica, right side: Secondary | ICD-10-CM | POA: Diagnosis not present

## 2022-01-15 DIAGNOSIS — I10 Essential (primary) hypertension: Secondary | ICD-10-CM | POA: Diagnosis not present

## 2022-01-15 LAB — POCT GLYCOSYLATED HEMOGLOBIN (HGB A1C): Hemoglobin A1C: 6.5 % — AB (ref 4.0–5.6)

## 2022-01-15 MED ORDER — METFORMIN HCL ER 500 MG PO TB24: 1000.0000 mg | ORAL_TABLET | Freq: Two times a day (BID) | ORAL | 3 refills | Status: DC

## 2022-01-15 NOTE — Progress Notes (Signed)
Subjective:    Patient ID: Jordan Sims, male    DOB: July 07, 1985, 36 y.o.   MRN: [id number]  CC: Jordan Sims is a 36 y.o. male who presents today for follow up.   HPI: Feels well today.  No new complaints.  Semaglutide was on backorder and he has stopped.   He is compliant metformin 500 mg once daily.  He is gaining weight since our last visit.  Endorses dietary indiscretion.  Weight gain is aggravating his low back.  Previously seen  Dr Tomasita Crumble Carroll,12/01/2020 given prednisone taper.  Reports an MRI done as well with [facility] ( unable to see) .  Referral pain management although he was never seen by Dr. Lanae Crumbly.  HTN-compliant with losartan 50 mg    HISTORY:  No past medical history on file. Past Surgical History:  Procedure Laterality Date   acl mcl left knee Left    Family History  Problem Relation Age of Onset   Hypertension Father    Thyroid cancer Neg Hx     Allergies: Patient has no known allergies. Current Outpatient Medications on File Prior to Visit  Medication Sig Dispense Refill   losartan (COZAAR) 50 MG tablet Take 1 tablet (50 mg total) by mouth daily. 90 tablet 3   meloxicam (MOBIC) 7.5 MG tablet Take 1 tablet (7.5 mg total) by mouth daily as needed for pain. 30 tablet 1   rosuvastatin (CRESTOR) 10 MG tablet Take 1 tablet (10 mg total) by mouth daily. 90 tablet 2   No current facility-administered medications on file prior to visit.    Social History   Tobacco Use   Smoking status: Light Smoker   Smokeless tobacco: Never   Tobacco comments:    Ocassionally  Vaping Use   Vaping Use: Never used  Substance Use Topics   Alcohol use: Yes   Drug use: No    Review of Systems  Constitutional:  Negative for chills and fever.  Respiratory:  Negative for cough.   Cardiovascular:  Negative for chest pain and palpitations.  Gastrointestinal:  Negative for nausea and vomiting.  Musculoskeletal:  Positive for back pain.      Objective:    BP 136/82  (BP Location: Left Arm, Patient Position: Sitting, Cuff Size: Normal)   Pulse 92   Temp 98.4 F (36.9 C) (Oral)   Ht 6\' 8"  (2.032 m)   Wt (!) 434 lb 12.8 oz (197.2 kg)   SpO2 96%   BMI 47.77 kg/m  BP Readings from Last 3 Encounters:  01/15/22 136/82  06/28/21 130/82  02/27/21 110/76   Wt Readings from Last 3 Encounters:  01/15/22 (!) 434 lb 12.8 oz (197.2 kg)  06/28/21 (!) 407 lb 9.6 oz (184.9 kg)  02/27/21 (!) 419 lb (190.1 kg)    Physical Exam Vitals reviewed.  Constitutional:      Appearance: He is well-developed.  Cardiovascular:     Rate and Rhythm: Regular rhythm.     Heart sounds: Normal heart sounds.  Pulmonary:     Effort: Pulmonary effort is normal. No respiratory distress.     Breath sounds: Normal breath sounds. No wheezing, rhonchi or rales.  Skin:    General: Skin is warm and dry.  Neurological:     Mental Status: He is alert.  Psychiatric:        Speech: Speech normal.        Behavior: Behavior normal.        Assessment & Plan:   Problem  List Items Addressed This Visit       Cardiovascular and Mediastinum   Essential hypertension    Chronic, stable.  Continue losartan 50 mg        Endocrine   DM (diabetes mellitus) (HCC) - Primary    Lab Results  Component Value Date   HGBA1C 6.5 (A) 01/15/2022  Unable to get semaglutide due to backorder.  We agreed appropriate to titrate metformin to aid in weight loss.  He will slowly increase metformin from 500 mg once daily to 2000 mg once daily.      Relevant Medications   metFORMIN (GLUCOPHAGE-XR) 500 MG 24 hr tablet   Other Relevant Orders   POCT HgB A1C (Completed)     Other   Low back pain    Chronic, suboptimal control.  Briefly discussed Cymbalta.  Patient more interested in returning to pain management for discussion of facet injection.  I placed a referral to Dr. Irine Seal      Relevant Orders   Ambulatory referral to Orthopedic Surgery   Morbid obesity with BMI of 45.0-49.9,  adult (HCC)   Relevant Medications   metFORMIN (GLUCOPHAGE-XR) 500 MG 24 hr tablet     I have discontinued Marcello Moores E. Sharma's Semaglutide (2 MG/DOSE). I have also changed his metFORMIN. Additionally, I am having him maintain his meloxicam, losartan, and rosuvastatin.   Meds ordered this encounter  Medications   metFORMIN (GLUCOPHAGE-XR) 500 MG 24 hr tablet    Sig: Take 2 tablets (1,000 mg total) by mouth 2 (two) times daily.    Dispense:  360 tablet    Refill:  3    Order Specific Question:   Supervising Provider    Answer:   [name] [[id number]]    Return precautions given.   Risks, benefits, and alternatives of the medications and treatment plan prescribed today were discussed, and patient expressed understanding.   Education regarding symptom management and diagnosis given to patient on AVS.  Continue to follow with Allegra Grana, FNP for routine health maintenance.   Cletus Gash and I agreed with plan.   Rennie Plowman, FNP

## 2022-01-15 NOTE — Assessment & Plan Note (Signed)
Chronic, suboptimal control.  Briefly discussed Cymbalta.  Patient more interested in returning to pain management for discussion of facet injection.  I placed a referral to Dr. Lanae Crumbly, Rosanne Gutting

## 2022-01-15 NOTE — Patient Instructions (Addendum)
   Please make sure that you titrate per below so not to cause any GI upset.  The instructions on the metformin bottle however say metformin 1000 mg twice daily so that you do not run out of medication when you are on maximum dose but you will need to titrate to get there.    Lets trial metformin  Start metformin XR with one 500mg  tablet at night. After one week, you may increase to two tablets at night ( total of 1000mg ) . The third week, you may take take two tablets at night and one tablet in the morning.  The fourth week, you may take two tablets in the morning ( 1000mg  total) and two tablets at night (1000mg  total). This will bring you to a maximum daily dose of 2000mg /day which is maximum dose. Along the way, if you want to increase more slowly, please do as this medication can cause GI discomfort and loose stools which usually get better with time , however some patients find that they can only tolerate a certain dose and cannot increase to maximum dose.   I placed a referral to  Thalia Party, MD Interventional Pain Management  You may also call her as well to schedule- [phone]

## 2022-01-15 NOTE — Progress Notes (Signed)
Discussed during OV. Please see OV notes

## 2022-01-15 NOTE — Assessment & Plan Note (Signed)
Lab Results  Component Value Date   HGBA1C 6.5 (A) 01/15/2022   Unable to get semaglutide due to backorder.  We agreed appropriate to titrate metformin to aid in weight loss.  He will slowly increase metformin from 500 mg once daily to 2000 mg once daily.

## 2022-01-15 NOTE — Assessment & Plan Note (Signed)
Chronic, stable.  Continue losartan 50 mg 

## 2022-04-25 ENCOUNTER — Encounter: Payer: Self-pay | Admitting: Family

## 2022-04-25 ENCOUNTER — Ambulatory Visit: Payer: 59 | Admitting: Family

## 2022-04-25 VITALS — BP 132/78 | HR 94 | Temp 98.0°F | Ht >= 80 in | Wt >= 6400 oz

## 2022-04-25 DIAGNOSIS — R7309 Other abnormal glucose: Secondary | ICD-10-CM

## 2022-04-25 DIAGNOSIS — I10 Essential (primary) hypertension: Secondary | ICD-10-CM | POA: Diagnosis not present

## 2022-04-25 DIAGNOSIS — E119 Type 2 diabetes mellitus without complications: Secondary | ICD-10-CM

## 2022-04-25 LAB — POCT GLYCOSYLATED HEMOGLOBIN (HGB A1C): Hemoglobin A1C: 6.4 % — AB (ref 4.0–5.6)

## 2022-04-25 MED ORDER — TIRZEPATIDE 5 MG/0.5ML ~~LOC~~ SOAJ: 5.0000 mg | SUBCUTANEOUS | 1 refills | Status: DC

## 2022-04-25 NOTE — Patient Instructions (Addendum)
We provided [facility] sample today 2.5 mg.  You will complete this dose for 4 weeks.  The next dose is Mounjaro 5 mg.  You 2.5-0 5 and there are 2 will start Mounjaro 5 mg after 4 weeks.  I have sent in Mounjaro 5 mg to [facility] pharmacy for you.     If Greggory Keen is not covered through your insurance, you may go to the [email or web address] to complete information for savings card.   You may also use the link below.   https://[email or web address]  You may take this savings to [facility], [facility] or [facility]. If you are unable to get mounjaro, please call to schedule an appointment with me so we can discuss alternative weight loss medications.   start Mounjaro 2.5mg  once per week injected subcutaneously ( [place])  in stomach. Please clean with alcohol swab prior to injection and be sure to rotate site. You may schedule a nurse visit if you would like to first injection.   After 4 weeks, and if tolerated and weight loss has not reached 1-2 lbs per week, please increase to 5mg  once per week [place].    Please read information on medication below and remember black box warning that you may not take if you or a family member is diagnosed with thyroid cancer (medullary thyroid cancer), or multiple endocrine neoplasia.       Tirzepatide Injection ) What is this medication? TIRZEPATIDE (tir ZEP a tide) treats type 2 diabetes. It works by increasing insulin levels in your body, which decreases your blood sugar (glucose). Changes to diet and exercise are often combined with this medication. This medicine may be used for other purposes; ask your health care provider or pharmacist if you have questions. COMMON BRAND NAME(S): MOUNJARO What should I tell my care team before I take this medication? They need to know if you have any of these conditions: Endocrine tumors (MEN 2) or if someone in your family had these tumors Eye disease, vision problems Gallbladder  disease History of pancreatitis Kidney disease Stomach or intestine problems Thyroid cancer or if someone in your family had thyroid cancer An unusual or allergic reaction to tirzepatide, other medications, foods, dyes, or preservatives Pregnant or trying to get pregnant Breast-feeding How should I use this medication? This medication is injected under the skin. You will be taught how to prepare and give it. It is given once every week (every 7 days). Keep taking it unless your health care provider tells you to stop. If you use this medication with insulin, you should inject this medication and the insulin separately. Do not mix them together. Do not give the injections right next to each other. Change (rotate) injection sites with each injection. This medication comes with INSTRUCTIONS FOR USE. Ask your pharmacist for directions on how to use this medication. Read the information carefully. Talk to your pharmacist or care team if you have questions. It is important that you put your used needles and syringes in a special sharps container. Do not put them in a trash can. If you do not have a sharps container, call your pharmacist or care team to get one. A special MedGuide will be given to you by the pharmacist with each prescription and refill. Be sure to read this information carefully each time. Talk to your care team about the use of this medication in children. Special care may be needed. Overdosage: If you think you have taken too much of this medicine contact a poison  control center or emergency room at once. NOTE: This medicine is only for you. Do not share this medicine with others. What if I miss a dose? If you miss a dose, take it as soon as you can unless it is more than 4 days (96 hours) late. If it is more than 4 days late, skip the missed dose. Take the next dose at the normal time. Do not take 2 doses within 3 days of each other. What may interact with this medication? Alcohol  containing beverages Antiviral medications for HIV or AIDS Aspirin and aspirin-like medications Beta-blockers like atenolol, metoprolol, propranolol Certain medications for blood pressure, heart disease, irregular heart beat Chromium Clonidine Diuretics Male hormones, such as estrogens or progestins, birth control pills Fenofibrate Gemfibrozil Guanethidine Isoniazid Lanreotide Male hormones or anabolic steroids MAOIs like Carbex, Eldepryl, Marplan, Nardil, and Parnate Medications for weight loss Medications for allergies, asthma, cold, or cough Medications for depression, anxiety, or psychotic disturbances Niacin Nicotine NSAIDs, medications for pain and inflammation, like ibuprofen or naproxen Octreotide Other medications for diabetes, like glyburide, glipizide, or glimepiride Pasireotide Pentamidine Phenytoin Probenecid Quinolone antibiotics such as ciprofloxacin, levofloxacin, ofloxacin Reserpine Some herbal dietary supplements Steroid medications such as prednisone or cortisone Sulfamethoxazole; trimethoprim Thyroid hormones Warfarin This list may not describe all possible interactions. Give your health care provider a list of all the medicines, herbs, non-prescription drugs, or dietary supplements you use. Also tell them if you smoke, drink alcohol, or use illegal drugs. Some items may interact with your medicine. What should I watch for while using this medication? Visit your care team for regular checks on your progress. Drink plenty of fluids while taking this medication. Check with your care team if you get an attack of severe diarrhea, nausea, and vomiting. The loss of too much body fluid can make it dangerous for you to take this medication. A test called the HbA1C (A1C) will be monitored. This is a simple blood test. It measures your blood sugar control over the last 2 to 3 months. You will receive this test every 3 to 6 months. Learn how to check your blood  sugar. Learn the symptoms of low and high blood sugar and how to manage them. Always carry a quick-source of sugar with you in case you have symptoms of low blood sugar. Examples include hard sugar candy or glucose tablets. Make sure others know that you can choke if you eat or drink when you develop serious symptoms of low blood sugar, such as seizures or unconsciousness. They must get medical help at once. Tell your care team if you have high blood sugar. You might need to change the dose of your medication. If you are sick or exercising more than usual, you might need to change the dose of your medication. Do not skip meals. Ask your care team if you should avoid alcohol. Many nonprescription cough and cold products contain sugar or alcohol. These can affect blood sugar. Pens should never be shared. Even if the needle is changed, sharing may result in passing of viruses like hepatitis or HIV. Wear a medical ID bracelet or chain, and carry a card that describes your disease and details of your medication and dosage times. Birth control may not work properly while you are taking this medication. If you take birth control pills by mouth, your care team may recommend another type of birth control for 4 weeks after you start this medication and for 4 weeks after each increase in your dose of  this medication. Ask your care team which birth control methods you should use. What side effects may I notice from receiving this medication? Side effects that you should report to your care team as soon as possible: Allergic reactions-skin rash, itching, hives, swelling of the face, lips, tongue, or throat Change in vision Dehydration-increased thirst, dry mouth, feeling faint or lightheaded, headache, dark yellow or brown urine Gallbladder problems-severe stomach pain, nausea, vomiting, fever Kidney injury-decrease in the amount of urine, swelling of the ankles, hands, or feet Pancreatitis-severe stomach pain that  spreads to your back or gets worse after eating or when touched, fever, nausea, vomiting Thyroid cancer-new mass or lump in the neck, pain or trouble swallowing, trouble breathing, hoarseness Side effects that usually do not require medical attention (report these to your care team if they continue or are bothersome): Constipation Diarrhea Loss of Appetite Nausea Stomach pain Upset stomach Vomiting This list may not describe all possible side effects. Call your doctor for medical advice about side effects. You may report side effects to FDA at 1-800-FDA-1088. Where should I keep my medication? Keep out of the reach of children and pets. Refrigeration (preferred): Store unopened pens in a refrigerator between 2 and 8 degrees C (36 and 46 degrees F). Keep it in the original carton until you are ready to take it. Do not freeze or use if the medication has been frozen. Protect from light. Get rid of any unused medication after the expiration date on the label. Room Temperature: The pen may be stored at room temperature below 30 degrees C (86 degrees F) for up to a total of 21 days if needed. Protect from light. Avoid exposure to extreme heat. If it is stored at room temperature, throw away any unused medication after 21 days or after it expires, whichever is first. The pen has glass parts. Handle it carefully. If you drop the pen on a hard surface, do not use it. Use a new pen for your injection. To get rid of medications that are no longer needed or have expired: Take the medication to a medication take-back program. Check with your pharmacy or law enforcement to find a location. If you cannot return the medication, ask your pharmacist or care team how to get rid of this medication safely. NOTE: This sheet is a summary. It may not cover all possible information. If you have questions about this medicine, talk to your doctor, pharmacist, or health care provider.  2022 Elsevier/Gold Standard  (2020-08-28 13:57:48)

## 2022-04-25 NOTE — Assessment & Plan Note (Signed)
Lab Results  Component Value Date   HGBA1C 6.4 (A) 04/25/2022   Excellent control.  Continue metformin 2000 mg daily.  We discussed at length his desire to lose weight and previously Ozempic being on backorder.  I provided him with sample of Mounjaro 2.5 mg today  I have sent a prescription for him to start at [facility] 5 mg after 4 weeks.  Counseled patient on side effects, mechanism of action and blackbox warning as it relates to thyroid cancer, multiple endocrine neoplasia.  He will let me know how he is doing.

## 2022-04-25 NOTE — Progress Notes (Signed)
   Assessment & Plan:  Type 2 diabetes mellitus without complication, without long-term current use of insulin (HCC) Assessment & Plan: Lab Results  Component Value Date   HGBA1C 6.4 (A) 04/25/2022   Excellent control.  Continue metformin 2000 mg daily.  We discussed at length his desire to lose weight and previously Ozempic being on backorder.  I provided him with sample of Mounjaro 2.5 mg today  I have sent a prescription for him to start at [facility] 5 mg after 4 weeks.  Counseled patient on side effects, mechanism of action and blackbox warning as it relates to thyroid cancer, multiple endocrine neoplasia.  He will let me know how he is doing.  Orders: -     Tirzepatide; Inject 5 mg into the skin once a week.  Dispense: 6 mL; Refill: 1  Elevated glucose -     POCT glycosylated hemoglobin (Hb A1C)  Essential hypertension Assessment & Plan: Chronic, stable.  Continue losartan 50 mg qd      Return precautions given.   Risks, benefits, and alternatives of the medications and treatment plan prescribed today were discussed, and patient expressed understanding.   Education regarding symptom management and diagnosis given to patient on AVS either electronically or printed.  Return in about 3 months (around 07/25/2022).  Mable Paris, FNP  Subjective:    Patient ID: Jordan Sims, male    DOB: 1985-06-12, 37 y.o.   MRN: [id number]  CC: VIRL COBLE is a 37 y.o. male who presents today for follow up.   HPI: Feels well today.  No new complaints.  He was never able to fill Ozempic due to backorder.  He is interested in weight loss.  No personal or family history of thyroid cancer, multiple endocrine neoplasia.   Compliant with metformin 2000mg  qd. He has not lost weight.  No personal or family history of thyroid cancer, multiple endocrine neoplasia  HTN- compliant with losartan 50mg  qd.   Allergies: Patient has no known allergies. Current Outpatient Medications on File  Prior to Visit  Medication Sig Dispense Refill   losartan (COZAAR) 50 MG tablet Take 1 tablet (50 mg total) by mouth daily. 90 tablet 3   meloxicam (MOBIC) 7.5 MG tablet Take 1 tablet (7.5 mg total) by mouth daily as needed for pain. 30 tablet 1   metFORMIN (GLUCOPHAGE-XR) 500 MG 24 hr tablet Take 2 tablets (1,000 mg total) by mouth 2 (two) times daily. 360 tablet 3   rosuvastatin (CRESTOR) 10 MG tablet Take 1 tablet (10 mg total) by mouth daily. 90 tablet 2   No current facility-administered medications on file prior to visit.    Review of Systems    Objective:    BP 132/78   Pulse 94   Temp 98 F (36.7 C) (Oral)   Ht 6\' 8"  (2.032 m)   Wt (!) 437 lb 9.6 oz (198.5 kg)   SpO2 98%   BMI 48.07 kg/m  BP Readings from Last 3 Encounters:  04/25/22 132/78  01/15/22 136/82  06/28/21 130/82   Wt Readings from Last 3 Encounters:  04/25/22 (!) 437 lb 9.6 oz (198.5 kg)  01/15/22 (!) 434 lb 12.8 oz (197.2 kg)  06/28/21 (!) 407 lb 9.6 oz (184.9 kg)    Physical Exam

## 2022-04-25 NOTE — Assessment & Plan Note (Signed)
Chronic, stable.  Continue losartan 50 mg qd 

## 2022-06-30 ENCOUNTER — Ambulatory Visit: Payer: 59 | Admitting: Internal Medicine

## 2022-07-25 ENCOUNTER — Ambulatory Visit: Payer: 59 | Admitting: Family

## 2022-08-07 ENCOUNTER — Encounter: Payer: Self-pay | Admitting: Family

## 2022-08-07 ENCOUNTER — Ambulatory Visit: Payer: 59 | Admitting: Family

## 2022-08-07 VITALS — BP 130/68 | HR 88 | Temp 98.2°F | Ht >= 80 in | Wt >= 6400 oz

## 2022-08-07 DIAGNOSIS — E119 Type 2 diabetes mellitus without complications: Secondary | ICD-10-CM

## 2022-08-07 DIAGNOSIS — R7309 Other abnormal glucose: Secondary | ICD-10-CM

## 2022-08-07 DIAGNOSIS — I10 Essential (primary) hypertension: Secondary | ICD-10-CM | POA: Diagnosis not present

## 2022-08-07 DIAGNOSIS — Z7984 Long term (current) use of oral hypoglycemic drugs: Secondary | ICD-10-CM | POA: Diagnosis not present

## 2022-08-07 DIAGNOSIS — Z8639 Personal history of other endocrine, nutritional and metabolic disease: Secondary | ICD-10-CM

## 2022-08-07 DIAGNOSIS — Z7985 Long-term (current) use of injectable non-insulin antidiabetic drugs: Secondary | ICD-10-CM

## 2022-08-07 LAB — POCT GLYCOSYLATED HEMOGLOBIN (HGB A1C): Hemoglobin A1C: 5.8 % — AB (ref 4.0–5.6)

## 2022-08-07 NOTE — Assessment & Plan Note (Signed)
Chronic, stable.  Continue losartan 50 mg qd 

## 2022-08-07 NOTE — Progress Notes (Signed)
Assessment & Plan:  Elevated glucose -     POCT glycosylated hemoglobin (Hb A1C) -     Microalbumin / creatinine urine ratio  Type 2 diabetes mellitus without complication, without long-term current use of insulin Assessment & Plan: Lab Results  Component Value Date   HGBA1C 5.8 (A) 08/07/2022   Excellent control.  Continue metformin 2000 mg daily.  Continue with Mounjaro 2.5 mg today   Orders: -     Lipid panel  History of vitamin D deficiency -     VITAMIN D 25 Hydroxy (Vit-D Deficiency, Fractures)  Essential hypertension Assessment & Plan: Chronic, stable.  Continue losartan 50 mg qd  Orders: -     Comprehensive metabolic panel -     TSH     Return precautions given.   Risks, benefits, and alternatives of the medications and treatment plan prescribed today were discussed, and patient expressed understanding.   Education regarding symptom management and diagnosis given to patient on AVS either electronically or printed.  Return in about 6 months (around 02/06/2023).  Rennie Plowman, FNP  Subjective:    Patient ID: Jordan Sims, male    DOB: [birth year], 37 y.o.   MRN: [id number]  CC: Jordan Sims is a 37 y.o. male who presents today for follow up.   HPI: He feels well today.  No new complaints.  Family is doing well.  Compliant Mounjaro 5 mg without complaint.  No nausea, constipation,chest pain or shortness of breath  He declines Tdap vaccine  Allergies: Patient has no known allergies. Current Outpatient Medications on File Prior to Visit  Medication Sig Dispense Refill   losartan (COZAAR) 50 MG tablet Take 1 tablet (50 mg total) by mouth daily. 90 tablet 3   meloxicam (MOBIC) 7.5 MG tablet Take 1 tablet (7.5 mg total) by mouth daily as needed for pain. 30 tablet 1   metFORMIN (GLUCOPHAGE-XR) 500 MG 24 hr tablet Take 2 tablets (1,000 mg total) by mouth 2 (two) times daily. 360 tablet 3   rosuvastatin (CRESTOR) 10 MG tablet Take 1 tablet (10 mg total)  by mouth daily. 90 tablet 2   tirzepatide (MOUNJARO) 5 MG/0.5ML Pen Inject 5 mg into the skin once a week. 6 mL 1   No current facility-administered medications on file prior to visit.    Review of Systems  Constitutional:  Negative for chills and fever.  Respiratory:  Negative for cough.   Cardiovascular:  Negative for chest pain and palpitations.  Gastrointestinal:  Negative for nausea and vomiting.      Objective:    BP 130/68   Pulse 88   Temp 98.2 F (36.8 C) (Oral)   Ht  (2.032 m)   Wt (!) 420 lb 12.8 oz (190.9 kg)   SpO2 96%   BMI 46.23 kg/m  BP Readings from Last 3 Encounters:  08/07/22 130/68  04/25/22 132/78  01/15/22 136/82   Wt Readings from Last 3 Encounters:  08/07/22 (!) 420 lb 12.8 oz (190.9 kg)  04/25/22 (!) 437 lb 9.6 oz (198.5 kg)  01/15/22 (!) 434 lb 12.8 oz (197.2 kg)    Physical Exam Vitals reviewed.  Constitutional:      Appearance: He is well-developed.  Cardiovascular:     Rate and Rhythm: Regular rhythm.     Heart sounds: Normal heart sounds.  Pulmonary:     Effort: Pulmonary effort is normal. No respiratory distress.     Breath sounds: Normal breath sounds. No wheezing, rhonchi or  rales.  Skin:    General: Skin is warm and dry.  Neurological:     Mental Status: He is alert.  Psychiatric:        Speech: Speech normal.        Behavior: Behavior normal.

## 2022-08-07 NOTE — Assessment & Plan Note (Signed)
Lab Results  Component Value Date   HGBA1C 5.8 (A) 08/07/2022   Excellent control.  Continue metformin 2000 mg daily.  Continue with Mounjaro 2.5 mg today

## 2022-08-07 NOTE — Progress Notes (Signed)
poct

## 2022-08-08 LAB — COMPREHENSIVE METABOLIC PANEL
ALT: 35 U/L (ref 0–53)
AST: 27 U/L (ref 0–37)
Albumin: 4.5 g/dL (ref 3.5–5.2)
Alkaline Phosphatase: 66 U/L (ref 39–117)
BUN: 10 mg/dL (ref 6–23)
CO2: 27 mEq/L (ref 19–32)
Calcium: 9.5 mg/dL (ref 8.4–10.5)
Chloride: 101 mEq/L (ref 96–112)
Creatinine, Ser: 0.95 mg/dL (ref 0.40–1.50)
GFR: 101.76 mL/min (ref >60.00)
Glucose, Bld: 78 mg/dL (ref 70–99)
Potassium: 4.1 mEq/L (ref 3.5–5.1)
Sodium: 137 mEq/L (ref 135–145)
Total Bilirubin: 0.5 mg/dL (ref 0.2–1.2)
Total Protein: 7.9 g/dL (ref 6.0–8.3)

## 2022-08-08 LAB — LIPID PANEL
Cholesterol: 138 mg/dL (ref 0–200)
HDL: 38.4 mg/dL — ABNORMAL LOW (ref >39.00)
LDL Cholesterol: 72 mg/dL (ref 0–99)
NonHDL: 99.27
Total CHOL/HDL Ratio: 4
Triglycerides: 135 mg/dL (ref 0.0–149.0)
VLDL: 27 mg/dL (ref 0.0–40.0)

## 2022-08-08 LAB — MICROALBUMIN / CREATININE URINE RATIO
Creatinine,U: 259.7 mg/dL
Microalb Creat Ratio: 1.5 mg/g (ref 0.0–30.0)
Microalb, Ur: 4 mg/dL — ABNORMAL HIGH (ref 0.0–1.9)

## 2022-08-08 LAB — VITAMIN D 25 HYDROXY (VIT D DEFICIENCY, FRACTURES): VITD: 18.13 ng/mL — ABNORMAL LOW (ref 30.00–100.00)

## 2022-08-08 LAB — TSH: TSH: 1.74 u[IU]/mL (ref 0.35–5.50)

## 2022-08-11 ENCOUNTER — Other Ambulatory Visit: Payer: Self-pay | Admitting: Family

## 2022-08-11 DIAGNOSIS — I10 Essential (primary) hypertension: Secondary | ICD-10-CM

## 2022-08-15 ENCOUNTER — Other Ambulatory Visit: Payer: Self-pay

## 2022-08-15 DIAGNOSIS — E119 Type 2 diabetes mellitus without complications: Secondary | ICD-10-CM

## 2022-08-15 DIAGNOSIS — E782 Mixed hyperlipidemia: Secondary | ICD-10-CM

## 2022-08-15 MED ORDER — LOSARTAN POTASSIUM 25 MG PO TABS: 25.0000 mg | ORAL_TABLET | Freq: Every day | ORAL | 3 refills | Status: DC

## 2022-08-15 MED ORDER — ROSUVASTATIN CALCIUM 10 MG PO TABS: 10.0000 mg | ORAL_TABLET | Freq: Every day | ORAL | 2 refills | Status: DC

## 2022-08-15 NOTE — Progress Notes (Signed)
Orders only

## 2022-08-15 NOTE — Addendum Note (Signed)
Addended by: [place], Yaslene Lindamood on: 08/15/2022 09:54 AM   Modules accepted: Orders

## 2023-02-06 ENCOUNTER — Ambulatory Visit: Payer: 59 | Admitting: Family

## 2023-02-12 ENCOUNTER — Ambulatory Visit: Payer: 59 | Admitting: Family

## 2023-02-23 ENCOUNTER — Ambulatory Visit: Payer: 59 | Admitting: Family

## 2023-04-03 ENCOUNTER — Encounter: Payer: Self-pay | Admitting: Family

## 2023-04-03 ENCOUNTER — Ambulatory Visit: Payer: 59 | Admitting: Family

## 2023-04-03 VITALS — BP 136/84 | HR 87 | Temp 97.9°F | Ht >= 80 in | Wt >= 6400 oz

## 2023-04-03 DIAGNOSIS — Z6841 Body Mass Index (BMI) 40.0 and over, adult: Secondary | ICD-10-CM

## 2023-04-03 DIAGNOSIS — E119 Type 2 diabetes mellitus without complications: Secondary | ICD-10-CM | POA: Diagnosis not present

## 2023-04-03 DIAGNOSIS — Z7984 Long term (current) use of oral hypoglycemic drugs: Secondary | ICD-10-CM

## 2023-04-03 DIAGNOSIS — I10 Essential (primary) hypertension: Secondary | ICD-10-CM

## 2023-04-03 DIAGNOSIS — Z125 Encounter for screening for malignant neoplasm of prostate: Secondary | ICD-10-CM

## 2023-04-03 LAB — BASIC METABOLIC PANEL
BUN: 12 mg/dL (ref 6–23)
CO2: 26 meq/L (ref 19–32)
Calcium: 9.5 mg/dL (ref 8.4–10.5)
Chloride: 105 meq/L (ref 96–112)
Creatinine, Ser: 0.94 mg/dL (ref 0.40–1.50)
GFR: 102.59 mL/min (ref >60.00)
Glucose, Bld: 91 mg/dL (ref 70–99)
Potassium: 4.6 meq/L (ref 3.5–5.1)
Sodium: 139 meq/L (ref 135–145)

## 2023-04-03 LAB — MICROALBUMIN / CREATININE URINE RATIO
Creatinine,U: 179.6 mg/dL
Microalb Creat Ratio: 1.2 mg/g (ref 0.0–30.0)
Microalb, Ur: 2.1 mg/dL — ABNORMAL HIGH (ref 0.0–1.9)

## 2023-04-03 LAB — HEMOGLOBIN A1C: Hgb A1c MFr Bld: 6.5 % (ref 4.6–6.5)

## 2023-04-03 LAB — PSA: PSA: 0.52 ng/mL (ref 0.10–4.00)

## 2023-04-03 MED ORDER — TIRZEPATIDE 7.5 MG/0.5ML ~~LOC~~ SOAJ: 7.5000 mg | SUBCUTANEOUS | 2 refills | Status: DC

## 2023-04-03 MED ORDER — METFORMIN HCL ER 500 MG PO TB24: 2000.0000 mg | ORAL_TABLET | Freq: Every day | ORAL | Status: DC

## 2023-04-03 NOTE — Assessment & Plan Note (Signed)
Slightly elevated today.  I have asked patient to confirm which dose of losartan ( 25mg  or 50mg ) he is taking so I can adjust medications as needed.  History of microalbuminemia

## 2023-04-03 NOTE — Progress Notes (Signed)
Assessment & Plan:  Type 2 diabetes mellitus without complication, without long-term current use of insulin (HCC) Assessment & Plan: Pending A1c.  Anticipate excellent control.  Increase Mounjaro to 7.5 mg for additional benefit of weight loss.  Continue metformin 2000 mg daily  Orders: -     Basic metabolic panel -     Microalbumin / creatinine urine ratio -     Hemoglobin A1c -     metFORMIN HCl ER; Take 4 tablets (2,000 mg total) by mouth daily. -     Tirzepatide; Inject 7.5 mg into the skin once a week.  Dispense: 6 mL; Refill: 2  Morbid obesity with BMI of 45.0-49.9, adult (HCC) -     metFORMIN HCl ER; Take 4 tablets (2,000 mg total) by mouth daily. -     Tirzepatide; Inject 7.5 mg into the skin once a week.  Dispense: 6 mL; Refill: 2  Screening for prostate cancer -     PSA  Essential hypertension Assessment & Plan: Slightly elevated today.  I have asked patient to confirm which dose of losartan ( 25mg  or 50mg ) he is taking so I can adjust medications as needed.  History of microalbuminemia      Return precautions given.   Risks, benefits, and alternatives of the medications and treatment plan prescribed today were discussed, and patient expressed understanding.   Education regarding symptom management and diagnosis given to patient on AVS either electronically or printed.  No follow-ups on file.  Rennie Plowman, FNP  Subjective:    Patient ID: Jordan Sims, male    DOB: Jan 20, 1986, 37 y.o.   MRN: [id number]  CC: MOHMAD SCAVUZZO is a 37 y.o. male who presents today for follow up.   HPI: Feels well today.  No new complaints.  He is pleased with weight loss.  Goal weight to be less than 400 pounds.  Interested in increasing Mounjaro.  Denies constipation, nausea, abdominal pain  He is unsure if he is taking losartan 25 mg daily or losartan 50 mg daily  Denies chest pain  Allergies: Patient has no known allergies. Current Outpatient Medications on File Prior to  Visit  Medication Sig Dispense Refill   losartan (COZAAR) 25 MG tablet Take 1 tablet (25 mg total) by mouth daily. 90 tablet 3   losartan (COZAAR) 50 MG tablet Take 1 tablet by mouth once daily 90 tablet 2   rosuvastatin (CRESTOR) 10 MG tablet Take 1 tablet (10 mg total) by mouth daily. 90 tablet 2   No current facility-administered medications on file prior to visit.    Review of Systems  Constitutional:  Negative for chills and fever.  Respiratory:  Negative for cough.   Cardiovascular:  Negative for chest pain and palpitations.  Gastrointestinal:  Negative for nausea and vomiting.      Objective:    BP 136/84   Pulse 87   Temp 97.9 F (36.6 C) (Oral)   Ht 6\' 8"  (2.032 m)   Wt (!) 412 lb 12.8 oz (187.2 kg)   SpO2 98%   BMI 45.35 kg/m  BP Readings from Last 3 Encounters:  04/03/23 136/84  08/07/22 130/68  04/25/22 132/78   Wt Readings from Last 3 Encounters:  04/03/23 (!) 412 lb 12.8 oz (187.2 kg)  08/07/22 (!) 420 lb 12.8 oz (190.9 kg)  04/25/22 (!) 437 lb 9.6 oz (198.5 kg)    Physical Exam Vitals reviewed.  Constitutional:      Appearance: He is well-developed.  Cardiovascular:     Rate and Rhythm: Regular rhythm.     Heart sounds: Normal heart sounds.  Pulmonary:     Effort: Pulmonary effort is normal. No respiratory distress.     Breath sounds: Normal breath sounds. No wheezing, rhonchi or rales.  Skin:    General: Skin is warm and dry.  Neurological:     Mental Status: He is alert.  Psychiatric:        Speech: Speech normal.        Behavior: Behavior normal.

## 2023-04-03 NOTE — Addendum Note (Signed)
Addended by: Warden Fillers on: 04/03/2023 09:33 AM   Modules accepted: Orders

## 2023-04-03 NOTE — Assessment & Plan Note (Signed)
Pending A1c.  Anticipate excellent control.  Increase Mounjaro to 7.5 mg for additional benefit of weight loss.  Continue metformin 2000 mg daily

## 2023-04-03 NOTE — Patient Instructions (Addendum)
When you get home send me a mychart and let me know what DOSE of losartan you are on.   I have both the 25mg  and 50mg  on your chart.   If Greggory Keen is not covered through your insurance, you may go to the [email or web address] to complete information for savings card.   You may also use the link below.   [email or web address]  You may take this savings to [facility], [facility] or [facility]. If you are unable to get mounjaro, please call to schedule an appointment with me so we can discuss alternative weight loss medications.   start Mounjaro 7. 5mg  once per week injected subcutaneously ( [place])  in stomach. Please clean with alcohol swab prior to injection and be sure to rotate site. You may schedule a nurse visit if you would like to first injection.   After 4 weeks, and if tolerated and weight loss has not reached 1-2 lbs per week, please we may increase to 10mg  to mg once per week [place].    Please read information on medication below and remember black box warning that you may not take if you or a family member is diagnosed with thyroid cancer (medullary thyroid cancer), or multiple endocrine neoplasia.       Tirzepatide Injection Greggory Keen) What is this medication? TIRZEPATIDE (tir ZEP a tide) treats type 2 diabetes. It works by increasing insulin levels in your body, which decreases your blood sugar (glucose). Changes to diet and exercise are often combined with this medication. This medicine may be used for other purposes; ask your health care provider or pharmacist if you have questions. COMMON BRAND NAME(S): MOUNJARO What should I tell my care team before I take this medication? They need to know if you have any of these conditions: Endocrine tumors (MEN 2) or if someone in your family had these tumors Eye disease, vision problems Gallbladder disease History of pancreatitis Kidney disease Stomach or intestine problems Thyroid cancer or if  someone in your family had thyroid cancer An unusual or allergic reaction to tirzepatide, other medications, foods, dyes, or preservatives Pregnant or trying to get pregnant Breast-feeding How should I use this medication? This medication is injected under the skin. You will be taught how to prepare and give it. It is given once every week (every 7 days). Keep taking it unless your health care provider tells you to stop. If you use this medication with insulin, you should inject this medication and the insulin separately. Do not mix them together. Do not give the injections right next to each other. Change (rotate) injection sites with each injection. This medication comes with INSTRUCTIONS FOR USE. Ask your pharmacist for directions on how to use this medication. Read the information carefully. Talk to your pharmacist or care team if you have questions. It is important that you put your used needles and syringes in a special sharps container. Do not put them in a trash can. If you do not have a sharps container, call your pharmacist or care team to get one. A special MedGuide will be given to you by the pharmacist with each prescription and refill. Be sure to read this information carefully each time. Talk to your care team about the use of this medication in children. Special care may be needed. Overdosage: If you think you have taken too much of this medicine contact a poison control center or emergency room at once. NOTE: This medicine is only for you. Do not share this medicine  with others. What if I miss a dose? If you miss a dose, take it as soon as you can unless it is more than 4 days (96 hours) late. If it is more than 4 days late, skip the missed dose. Take the next dose at the normal time. Do not take 2 doses within 3 days of each other. What may interact with this medication? Alcohol containing beverages Antiviral medications for HIV or AIDS Aspirin and aspirin-like  medications Beta-blockers like atenolol, metoprolol, propranolol Certain medications for blood pressure, heart disease, irregular heart beat Chromium Clonidine Diuretics Male hormones, such as estrogens or progestins, birth control pills Fenofibrate Gemfibrozil Guanethidine Isoniazid Lanreotide Male hormones or anabolic steroids MAOIs like Carbex, Eldepryl, Marplan, Nardil, and Parnate Medications for weight loss Medications for allergies, asthma, cold, or cough Medications for depression, anxiety, or psychotic disturbances Niacin Nicotine NSAIDs, medications for pain and inflammation, like ibuprofen or naproxen Octreotide Other medications for diabetes, like glyburide, glipizide, or glimepiride Pasireotide Pentamidine Phenytoin Probenecid Quinolone antibiotics such as ciprofloxacin, levofloxacin, ofloxacin Reserpine Some herbal dietary supplements Steroid medications such as prednisone or cortisone Sulfamethoxazole; trimethoprim Thyroid hormones Warfarin This list may not describe all possible interactions. Give your health care provider a list of all the medicines, herbs, non-prescription drugs, or dietary supplements you use. Also tell them if you smoke, drink alcohol, or use illegal drugs. Some items may interact with your medicine. What should I watch for while using this medication? Visit your care team for regular checks on your progress. Drink plenty of fluids while taking this medication. Check with your care team if you get an attack of severe diarrhea, nausea, and vomiting. The loss of too much body fluid can make it dangerous for you to take this medication. A test called the HbA1C (A1C) will be monitored. This is a simple blood test. It measures your blood sugar control over the last 2 to 3 months. You will receive this test every 3 to 6 months. Learn how to check your blood sugar. Learn the symptoms of low and high blood sugar and how to manage them. Always  carry a quick-source of sugar with you in case you have symptoms of low blood sugar. Examples include hard sugar candy or glucose tablets. Make sure others know that you can choke if you eat or drink when you develop serious symptoms of low blood sugar, such as seizures or unconsciousness. They must get medical help at once. Tell your care team if you have high blood sugar. You might need to change the dose of your medication. If you are sick or exercising more than usual, you might need to change the dose of your medication. Do not skip meals. Ask your care team if you should avoid alcohol. Many nonprescription cough and cold products contain sugar or alcohol. These can affect blood sugar. Pens should never be shared. Even if the needle is changed, sharing may result in passing of viruses like hepatitis or HIV. Wear a medical ID bracelet or chain, and carry a card that describes your disease and details of your medication and dosage times. Birth control may not work properly while you are taking this medication. If you take birth control pills by mouth, your care team may recommend another type of birth control for 4 weeks after you start this medication and for 4 weeks after each increase in your dose of this medication. Ask your care team which birth control methods you should use. What side effects may I notice  from receiving this medication? Side effects that you should report to your care team as soon as possible: Allergic reactions-skin rash, itching, hives, swelling of the face, lips, tongue, or throat Change in vision Dehydration-increased thirst, dry mouth, feeling faint or lightheaded, headache, dark yellow or brown urine Gallbladder problems-severe stomach pain, nausea, vomiting, fever Kidney injury-decrease in the amount of urine, swelling of the ankles, hands, or feet Pancreatitis-severe stomach pain that spreads to your back or gets worse after eating or when touched, fever, nausea,  vomiting Thyroid cancer-new mass or lump in the neck, pain or trouble swallowing, trouble breathing, hoarseness Side effects that usually do not require medical attention (report these to your care team if they continue or are bothersome): Constipation Diarrhea Loss of Appetite Nausea Stomach pain Upset stomach Vomiting This list may not describe all possible side effects. Call your doctor for medical advice about side effects. You may report side effects to FDA at 1-800-FDA-1088. Where should I keep my medication? Keep out of the reach of children and pets. Refrigeration (preferred): Store unopened pens in a refrigerator between 2 and 8 degrees C (36 and 46 degrees F). Keep it in the original carton until you are ready to take it. Do not freeze or use if the medication has been frozen. Protect from light. Get rid of any unused medication after the expiration date on the label. Room Temperature: The pen may be stored at room temperature below 30 degrees C (86 degrees F) for up to a total of 21 days if needed. Protect from light. Avoid exposure to extreme heat. If it is stored at room temperature, throw away any unused medication after 21 days or after it expires, whichever is first. The pen has glass parts. Handle it carefully. If you drop the pen on a hard surface, do not use it. Use a new pen for your injection. To get rid of medications that are no longer needed or have expired: Take the medication to a medication take-back program. Check with your pharmacy or law enforcement to find a location. If you cannot return the medication, ask your pharmacist or care team how to get rid of this medication safely. NOTE: This sheet is a summary. It may not cover all possible information. If you have questions about this medicine, talk to your doctor, pharmacist, or health care provider.  2022 Elsevier/Gold Standard (2020-08-28 13:57:48)

## 2023-04-13 NOTE — Telephone Encounter (Signed)
LVM to  confirm if he has been taking losartan 25mg  and losartan 50mg  together for the past few months or no

## 2023-04-13 NOTE — Telephone Encounter (Signed)
Can you call pt and confirm if he has been taking losartan 25mg  and losartan 50mg  together for the past few months or no?

## 2023-05-07 ENCOUNTER — Other Ambulatory Visit: Payer: Self-pay | Admitting: Family

## 2023-05-07 DIAGNOSIS — E119 Type 2 diabetes mellitus without complications: Secondary | ICD-10-CM

## 2023-05-07 DIAGNOSIS — Z6841 Body Mass Index (BMI) 40.0 and over, adult: Secondary | ICD-10-CM

## 2023-05-11 ENCOUNTER — Encounter: Payer: Self-pay | Admitting: Family

## 2023-05-11 NOTE — Telephone Encounter (Signed)
Printed off copy of A1c  for past 90 days per request spoke to pt he will pick up placed upfront for pt to pick up

## 2023-08-06 ENCOUNTER — Ambulatory Visit: Payer: 59 | Admitting: Family

## 2023-09-28 ENCOUNTER — Other Ambulatory Visit: Payer: Self-pay | Admitting: Family

## 2023-10-19 ENCOUNTER — Other Ambulatory Visit: Payer: Self-pay | Admitting: Family

## 2023-10-19 DIAGNOSIS — E782 Mixed hyperlipidemia: Secondary | ICD-10-CM

## 2023-11-02 ENCOUNTER — Other Ambulatory Visit: Payer: Self-pay | Admitting: Family

## 2023-11-02 DIAGNOSIS — I10 Essential (primary) hypertension: Secondary | ICD-10-CM

## 2023-11-03 NOTE — Telephone Encounter (Signed)
 LVM to inform pt that he needs an appt in office to rx sent to pt preffered pharmacy

## 2023-11-09 ENCOUNTER — Other Ambulatory Visit: Payer: Self-pay | Admitting: Family

## 2023-11-09 DIAGNOSIS — I10 Essential (primary) hypertension: Secondary | ICD-10-CM

## 2023-12-17 ENCOUNTER — Encounter: Payer: Self-pay | Admitting: Family

## 2023-12-17 ENCOUNTER — Ambulatory Visit: Admitting: Family

## 2023-12-17 VITALS — BP 132/80 | HR 96 | Temp 98.6°F | Ht >= 80 in | Wt >= 6400 oz

## 2023-12-17 DIAGNOSIS — E782 Mixed hyperlipidemia: Secondary | ICD-10-CM

## 2023-12-17 DIAGNOSIS — E785 Hyperlipidemia, unspecified: Secondary | ICD-10-CM | POA: Diagnosis not present

## 2023-12-17 DIAGNOSIS — Z125 Encounter for screening for malignant neoplasm of prostate: Secondary | ICD-10-CM | POA: Diagnosis not present

## 2023-12-17 DIAGNOSIS — R7309 Other abnormal glucose: Secondary | ICD-10-CM | POA: Diagnosis not present

## 2023-12-17 DIAGNOSIS — Z136 Encounter for screening for cardiovascular disorders: Secondary | ICD-10-CM | POA: Diagnosis not present

## 2023-12-17 DIAGNOSIS — E119 Type 2 diabetes mellitus without complications: Secondary | ICD-10-CM

## 2023-12-17 DIAGNOSIS — Z13220 Encounter for screening for lipoid disorders: Secondary | ICD-10-CM

## 2023-12-17 DIAGNOSIS — Z7984 Long term (current) use of oral hypoglycemic drugs: Secondary | ICD-10-CM

## 2023-12-17 DIAGNOSIS — I10 Essential (primary) hypertension: Secondary | ICD-10-CM

## 2023-12-17 DIAGNOSIS — Z6841 Body Mass Index (BMI) 40.0 and over, adult: Secondary | ICD-10-CM

## 2023-12-17 LAB — POCT GLYCOSYLATED HEMOGLOBIN (HGB A1C): Hemoglobin A1C: 5.9 % — AB (ref 4.0–5.6)

## 2023-12-17 MED ORDER — TIRZEPATIDE 10 MG/0.5ML ~~LOC~~ SOAJ: 10.0000 mg | SUBCUTANEOUS | 3 refills | Status: AC

## 2023-12-17 MED ORDER — ROSUVASTATIN CALCIUM 10 MG PO TABS
10.0000 mg | ORAL_TABLET | Freq: Every day | ORAL | 3 refills | Status: AC
Start: 2023-12-17 — End: ?

## 2023-12-17 MED ORDER — LOSARTAN POTASSIUM 25 MG PO TABS: 25.0000 mg | ORAL_TABLET | Freq: Every day | ORAL | 3 refills | Status: DC

## 2023-12-17 MED ORDER — LOSARTAN POTASSIUM 50 MG PO TABS: 50.0000 mg | ORAL_TABLET | Freq: Every day | ORAL | 3 refills | Status: DC

## 2023-12-17 MED ORDER — METFORMIN HCL ER 500 MG PO TB24
1000.0000 mg | ORAL_TABLET | Freq: Two times a day (BID) | ORAL | 3 refills | Status: AC
Start: 2023-12-17 — End: ?

## 2023-12-17 NOTE — Assessment & Plan Note (Signed)
 Chronic, stable.  Continue losartan  75 mg daily.  Pending urine micro

## 2023-12-17 NOTE — Assessment & Plan Note (Addendum)
 Chronic, stable.  Discussed weight loss goals, creating caloric deficit and abstaining from Brisk tea.  Agreed to increase Mounjaro  to 10 mg weekly as long as tolerated.  Continue metformin  1000 mg twice daily.  Refills provided today.

## 2023-12-17 NOTE — Patient Instructions (Addendum)
Please download Myfitness Pal App ( basic version is free).   You may log every thing you eat for even 2-3 days to get a better of idea of total daily calories. To loose weight, we have to create caloric deficit to loose weight. The goal is 1-2 lbs per week of weight loss.   Excellent article below from [facility].   https://www.health.CriticalZ.it  Calorie counting made easy  Eat less, exercise more. If only it were that simple! As most dieters know, losing weight can be very challenging. As this report details, a range of influences can affect how people gain and lose weight. But a basic understanding of how to tip your energy balance in favor of weight loss is a good place to start.  Start by determining how many calories you should consume each day. To do so, you need to know how many calories you need to maintain your current weight. Doing this requires a few simple calculations.  First, multiply your current weight by 15 -- that's roughly the number of calories per pound of body weight needed to maintain your current weight if you are moderately active. Moderately active means getting at least 30 minutes of physical activity a day in the form of exercise (walking at a brisk pace, climbing stairs, or active gardening). Let's say you're a woman who is 5 feet, 4 inches tall and weighs 155 pounds, and you need to lose about 15 pounds to put you in a healthy weight range. If you multiply 155 by 15, you will get 2,325, which is the number of calories per day that you need in order to maintain your current weight (weight-maintenance calories). To lose weight, you will need to get below that total.  For example, to lose 1 to 2 pounds a week -- a rate that experts consider safe -- your food consumption should provide 500 to 1,000 calories less than your total weight-maintenance calories. If you need 2,325 calories a day to maintain your current weight,  reduce your daily calories to between 1,325 and 1,825. If you are sedentary, you will also need to build more activity into your day. In order to lose at least a pound a week, try to do at least 30 minutes of physical activity on most days, and reduce your daily calorie intake by at least 500 calories. However, calorie intake should not fall below 1,200 a day in women or 1,500 a day in men, except under the supervision of a health professional. Eating too few calories can endanger your health by depriving you of needed nutrients.  Meeting your calorie target How can you meet your daily calorie target? One approach is to add up the number of calories per serving of all the foods that you eat, and then plan your menus accordingly. You can buy books that list calories per serving for many foods. In addition, the nutrition labels on all packaged foods and beverages provide calories per serving information. Make a point of reading the labels of the foods and drinks you use, noting the number of calories and the serving sizes. Many recipes published in cookbooks, newspapers, and magazines provide similar information.  If you hate counting calories, a different approach is to restrict how much and how often you eat, and to eat meals that are low in calories. Dietary guidelines issued by the American Heart Association stress common sense in choosing your foods rather than focusing strictly on numbers, such as total calories or calories from fat.  Whichever method you choose, research shows that a regular eating schedule -- with meals and snacks planned for certain times each day -- makes for the most successful approach. The same applies after you have lost weight and want to keep it off. Sticking with an eating schedule increases your chance of maintaining your new weight.    This is  Dr. Melina Schools  ( an amazing physician in my office!)  example of a  "Low GI"  Diet:  It will allow you to lose 4 to 8  lbs  per month  if you follow it carefully.  Your goal with exercise is a minimum of 30 minutes of aerobic exercise 5 days per week (Walking does not count once it becomes easy!)    All of the foods can be found at grocery stores and in bulk at [employer].  The Atkins protein bars and shakes are available in more varieties at Target, WalMart and [employer].     7 AM Breakfast:  Choose from the following:  Low carbohydrate Protein  Shakes (I recommend the  Premier Protein chocolate shakes,  EAS AdvantEdge "Carb Control" shakes  Or the Atkins shakes all are under 3 net carbs)     a scrambled egg/bacon/cheese burrito made with [employer]'s "carb balance" whole wheat tortilla  (about 10 net carbs )  Medical laboratory scientific officer (basically a quiche without the pastry crust) that is eaten cold and very convenient way to get your eggs.  8 carbs)  If you make your own protein shakes, avoid bananas and pineapple,  And use low carb greek yogurt or original /unsweetened almond or soy milk    Avoid cereal and bananas, oatmeal and cream of wheat and grits. They are loaded with carbohydrates!   10 AM: high protein snack:  Protein bar by Atkins (the snack size, under 200 cal, usually < 6 net carbs).    A stick of cheese:  Around 1 carb,  100 cal     Dannon Light n Fit [place] Yogurt  (80 cal, 8 carbs)  Other so called "protein bars" and Greek yogurts tend to be loaded with carbohydrates.  Remember, in food advertising, the word "energy" is synonymous for " carbohydrate."  Lunch:   A Sandwich using the bread choices listed, Can use any  Eggs,  lunchmeat, grilled meat or canned tuna), avocado, regular mayo/mustard  and cheese.  A Salad using blue cheese, ranch,  Goddess or vinagrette,  Avoid taco shells, croutons or "confetti" and no "candied nuts" but regular nuts OK.   No pretzels, nabs  or chips.  Pickles and miniature sweet peppers are a good low carb alternative that provide a "crunch"  The bread is the only  source of carbohydrate in a sandwich and  can be decreased by trying some of the attached alternatives to traditional loaf bread   Avoid "Low fat dressings, as well as Reyne Dumas and [employer] dressings They are loaded with sugar!   3 PM/ Mid day  Snack:  Consider  1 ounce of  almonds, walnuts, pistachios, pecans, peanuts,  Macadamia nuts or a nut medley.  Avoid "granola and granola bars "  Mixed nuts are ok in moderation as long as there are no raisins,  cranberries or dried fruit.   KIND bars are OK if you get the low glycemic index variety   Try the prosciutto/mozzarella cheese sticks by [employer]  In deli /backery section   High protein  6 PM  Dinner:     Meat/fowl/fish with a green salad, and either broccoli, cauliflower, green beans, spinach, brussel sprouts or  Lima beans. DO NOT BREAD THE PROTEIN!!      There is a low carb pasta by Dreamfield's that is acceptable and tastes great: only 5 digestible carbs/serving.( All grocery stores but BJs carry it ) Several ready made meals are available low carb:   Try Michel Angelo's chicken piccata or chicken or eggplant parm over low carb pasta.(Lowes and BJs)   Clifton Custard Sanchez's "Carnitas" (pulled pork, no sauce,  0 carbs) or his beef pot roast to make a dinner burrito (at [employer]J's)  Pesto over low carb pasta (bj's sells a good quality pesto in the center refrigerated section of the deli   Try satueeing  Roosvelt Harps with mushroooms as a good side   [employer] Giant makes a mashed cauliflower that tastes like mashed potatoes  Whole wheat pasta is still full of digestible carbs and  Not as low in glycemic index as Dreamfield's.   Brown rice is still rice,  So skip the rice and noodles if you eat [place] or [place] (or at least limit to 1/2 cup)  9 PM snack :   Breyer's "low carb" fudgsicle or  ice cream bar (Carb Smart line), or  Weight Watcher's ice cream bar , or another "no sugar added" ice cream;  a serving of fresh berries/cherries with whipped  cream   Cheese or DANNON'S LlG[employer] GREEK YOGURT  8 ounces of [place] unsweetened almond/cococunut milk    Treat yourself to a parfait made with whipped cream blueberiies, walnuts and vanilla greek yogurt  Avoid bananas, pineapple, grapes  and watermelon on a regular basis because they are high in sugar.  THINK OF THEM AS DESSERT  Remember that snack Substitutions should be less than 10 NET carbs per serving and meals < 20 carbs. Remember to subtract fiber grams to get the "net carbs."

## 2023-12-17 NOTE — Progress Notes (Signed)
 Assessment & Plan:  Type 2 diabetes mellitus without complication, without long-term current use of insulin (HCC) Assessment & Plan: Chronic, stable.  Discussed weight loss goals, creating caloric deficit and abstaining from Brisk tea.  Agreed to increase Mounjaro  to 10 mg weekly as long as tolerated.  Continue metformin  1000 mg twice daily.  Refills provided today.  Orders: -     CBC with Differential/Platelet -     Comprehensive metabolic panel with GFR -     Lipid panel -     TSH -     Microalbumin / creatinine urine ratio -     metFORMIN  HCl ER; Take 2 tablets (1,000 mg total) by mouth 2 (two) times daily.  Dispense: 360 tablet; Refill: 3  Elevated glucose -     POCT glycosylated hemoglobin (Hb A1C)  Hyperlipidemia, unspecified hyperlipidemia type  Essential hypertension Assessment & Plan: Chronic, stable.  Continue losartan  75 mg daily.  Pending urine micro  Orders: -     Losartan  Potassium; Take 1 tablet (50 mg total) by mouth daily.  Dispense: 90 tablet; Refill: 3  Screening for prostate cancer -     PSA  Encounter for lipid screening for cardiovascular disease -     Lipid panel  Mixed hyperlipidemia -     Rosuvastatin  Calcium ; Take 1 tablet (10 mg total) by mouth daily.  Dispense: 90 tablet; Refill: 3  Morbid obesity with BMI of 45.0-49.9, adult (HCC) -     metFORMIN  HCl ER; Take 2 tablets (1,000 mg total) by mouth 2 (two) times daily.  Dispense: 360 tablet; Refill: 3  Other orders -     Losartan  Potassium; Take 1 tablet (25 mg total) by mouth daily.  Dispense: 90 tablet; Refill: 3 -     Tirzepatide ; Inject 10 mg into the skin once a week.  Dispense: 6 mL; Refill: 3     Return precautions given.   Risks, benefits, and alternatives of the medications and treatment plan prescribed today were discussed, and patient expressed understanding.   Education regarding symptom management and diagnosis given to patient on AVS either electronically or printed.  Return  in about 3 months (around 03/17/2024).  Jordan Northern, FNP  Subjective:    Patient ID: Jordan Sims, male    DOB: Nov 16, 1985, 39 y.o.   MRN: [id number]  CC: Jordan Sims is a 39 y.o. male who presents today for follow up.   HPI: Today.  No new complaints.  Request medication refills.  He is tolerating Mounjaro  7.5 mg and interested in dose increase for additional weight loss.  He denies nausea, constipation, abdominal pain.  He endorsed dietary discretion with a brisk tea melon drinks.  Usually 24 ounces per day.  He had been 435lbs. Goal weight 385lbs.      Allergies: Patient has no known allergies. No current outpatient medications on file prior to visit.   No current facility-administered medications on file prior to visit.    Review of Systems  Constitutional:  Negative for chills and fever.  Respiratory:  Negative for cough.   Cardiovascular:  Negative for chest pain and palpitations.  Gastrointestinal:  Negative for nausea and vomiting.      Objective:    BP 132/80   Pulse 96   Temp 98.6 F (37 C) (Oral)   Ht 6' 8 (2.032 m)   Wt (!) 404 lb 1.6 oz (183.3 kg)   SpO2 99%   BMI 44.39 kg/m  BP Readings from Last 3  Encounters:  12/17/23 132/80  04/03/23 136/84  08/07/22 130/68   Wt Readings from Last 3 Encounters:  12/17/23 (!) 404 lb 1.6 oz (183.3 kg)  04/03/23 (!) 412 lb 12.8 oz (187.2 kg)  08/07/22 (!) 420 lb 12.8 oz (190.9 kg)    Physical Exam Vitals reviewed.  Constitutional:      Appearance: He is well-developed.  Cardiovascular:     Rate and Rhythm: Regular rhythm.     Heart sounds: Normal heart sounds.  Pulmonary:     Effort: Pulmonary effort is normal. No respiratory distress.     Breath sounds: Normal breath sounds. No wheezing, rhonchi or rales.  Skin:    General: Skin is warm and dry.  Neurological:     Mental Status: He is alert.  Psychiatric:        Speech: Speech normal.        Behavior: Behavior normal.

## 2023-12-18 LAB — COMPREHENSIVE METABOLIC PANEL WITH GFR
ALT: 35 U/L (ref 0–53)
AST: 21 U/L (ref 0–37)
Albumin: 4.5 g/dL (ref 3.5–5.2)
Alkaline Phosphatase: 68 U/L (ref 39–117)
BUN: 13 mg/dL (ref 6–23)
CO2: 29 meq/L (ref 19–32)
Calcium: 9.7 mg/dL (ref 8.4–10.5)
Chloride: 101 meq/L (ref 96–112)
Creatinine, Ser: 1.06 mg/dL (ref 0.40–1.50)
GFR: 88.38 mL/min (ref >60.00)
Glucose, Bld: 91 mg/dL (ref 70–99)
Potassium: 4.7 meq/L (ref 3.5–5.1)
Sodium: 139 meq/L (ref 135–145)
Total Bilirubin: 0.5 mg/dL (ref 0.2–1.2)
Total Protein: 7.7 g/dL (ref 6.0–8.3)

## 2023-12-18 LAB — CBC WITH DIFFERENTIAL/PLATELET
Basophils Absolute: 0 K/uL (ref 0.0–0.1)
Basophils Relative: 0.4 % (ref 0.0–3.0)
Eosinophils Absolute: 0.2 K/uL (ref 0.0–0.7)
Eosinophils Relative: 2.3 % (ref 0.0–5.0)
HCT: 46.2 % (ref 39.0–52.0)
Hemoglobin: 15.1 g/dL (ref 13.0–17.0)
Lymphocytes Relative: 18.8 % (ref 12.0–46.0)
Lymphs Abs: 1.9 K/uL (ref 0.7–4.0)
MCHC: 32.6 g/dL (ref 30.0–36.0)
MCV: 88.1 fl (ref 78.0–100.0)
Monocytes Absolute: 1.1 K/uL — ABNORMAL HIGH (ref 0.1–1.0)
Monocytes Relative: 11.3 % (ref 3.0–12.0)
Neutro Abs: 6.8 K/uL (ref 1.4–7.7)
Neutrophils Relative %: 67.2 % (ref 43.0–77.0)
Platelets: 236 K/uL (ref 150.0–400.0)
RBC: 5.25 Mil/uL (ref 4.22–5.81)
RDW: 15 % (ref 11.5–15.5)
WBC: 10.1 K/uL (ref 4.0–10.5)

## 2023-12-18 LAB — TSH: TSH: 0.57 u[IU]/mL (ref 0.35–5.50)

## 2023-12-18 LAB — LIPID PANEL
Cholesterol: 141 mg/dL (ref 0–200)
HDL: 44.1 mg/dL (ref >39.00)
LDL Cholesterol: 70 mg/dL (ref 0–99)
NonHDL: 96.91
Total CHOL/HDL Ratio: 3
Triglycerides: 136 mg/dL (ref 0.0–149.0)
VLDL: 27.2 mg/dL (ref 0.0–40.0)

## 2023-12-18 LAB — PSA: PSA: 0.52 ng/mL (ref 0.10–4.00)

## 2023-12-18 LAB — MICROALBUMIN / CREATININE URINE RATIO
Creatinine,U: 324.9 mg/dL
Microalb Creat Ratio: 10.1 mg/g (ref 0.0–30.0)
Microalb, Ur: 3.3 mg/dL — ABNORMAL HIGH (ref 0.0–1.9)

## 2023-12-21 ENCOUNTER — Ambulatory Visit: Payer: Self-pay | Admitting: Family

## 2023-12-29 MED ORDER — LOSARTAN POTASSIUM 100 MG PO TABS: 100.0000 mg | ORAL_TABLET | Freq: Every day | ORAL | 1 refills | Status: DC

## 2024-03-17 ENCOUNTER — Ambulatory Visit: Admitting: Family

## 2024-03-17 NOTE — Progress Notes (Deleted)
   Assessment & Plan:  There are no diagnoses linked to this encounter.   Return precautions given.   Risks, benefits, and alternatives of the medications and treatment plan prescribed today were discussed, and patient expressed understanding.   Education regarding symptom management and diagnosis given to patient on AVS either electronically or printed.  No follow-ups on file.  [name], FNP  Subjective:    Patient ID: Jordan Sims, male    DOB: [birth year], 38 y.o.   MRN: [id number]  CC: Jordan Sims is a 38 y.o. male who presents today for follow up.   HPI: HPI  Allergies: Patient has no known allergies. Current Outpatient Medications on File Prior to Visit  Medication Sig Dispense Refill   losartan  (COZAAR ) 100 MG tablet Take 1 tablet (100 mg total) by mouth daily. 90 tablet 1   metFORMIN  (GLUCOPHAGE -XR) 500 MG 24 hr tablet Take 2 tablets (1,000 mg total) by mouth 2 (two) times daily. 360 tablet 3   rosuvastatin  (CRESTOR ) 10 MG tablet Take 1 tablet (10 mg total) by mouth daily. 90 tablet 3   tirzepatide  (MOUNJARO ) 10 MG/0.5ML Pen Inject 10 mg into the skin once a week. 6 mL 3   No current facility-administered medications on file prior to visit.    Review of Systems    Objective:    There were no vitals taken for this visit. BP Readings from Last 3 Encounters:  12/17/23 132/80  04/03/23 136/84  08/07/22 130/68   Wt Readings from Last 3 Encounters:  12/17/23 (!) 404 lb 1.6 oz (183.3 kg)  04/03/23 (!) 412 lb 12.8 oz (187.2 kg)  08/07/22 (!) 420 lb 12.8 oz (190.9 kg)    Physical Exam

## 2024-03-21 ENCOUNTER — Encounter: Payer: Self-pay | Admitting: Family

## 2024-04-28 ENCOUNTER — Encounter: Payer: Self-pay | Admitting: Family

## 2024-04-28 ENCOUNTER — Ambulatory Visit: Admitting: Family

## 2024-04-28 VITALS — BP 130/78 | HR 81 | Temp 98.0°F | Ht 77.0 in | Wt >= 6400 oz

## 2024-04-28 DIAGNOSIS — I10 Essential (primary) hypertension: Secondary | ICD-10-CM

## 2024-04-28 DIAGNOSIS — E119 Type 2 diabetes mellitus without complications: Secondary | ICD-10-CM | POA: Diagnosis not present

## 2024-04-28 DIAGNOSIS — Z7985 Long-term (current) use of injectable non-insulin antidiabetic drugs: Secondary | ICD-10-CM | POA: Diagnosis not present

## 2024-04-28 DIAGNOSIS — Z7984 Long term (current) use of oral hypoglycemic drugs: Secondary | ICD-10-CM | POA: Diagnosis not present

## 2024-04-28 LAB — CBC WITH DIFFERENTIAL/PLATELET
Basophils Absolute: 0 K/uL (ref 0.0–0.1)
Basophils Relative: 0.3 % (ref 0.0–3.0)
Eosinophils Absolute: 0.2 K/uL (ref 0.0–0.7)
Eosinophils Relative: 1.7 % (ref 0.0–5.0)
HCT: 43.8 % (ref 39.0–52.0)
Hemoglobin: 14.4 g/dL (ref 13.0–17.0)
Lymphocytes Relative: 27.9 % (ref 12.0–46.0)
Lymphs Abs: 2.5 K/uL (ref 0.7–4.0)
MCHC: 32.9 g/dL (ref 30.0–36.0)
MCV: 86.9 fl (ref 78.0–100.0)
Monocytes Absolute: 0.8 K/uL (ref 0.1–1.0)
Monocytes Relative: 8.5 % (ref 3.0–12.0)
Neutro Abs: 5.6 K/uL (ref 1.4–7.7)
Neutrophils Relative %: 61.6 % (ref 43.0–77.0)
Platelets: 261 K/uL (ref 150.0–400.0)
RBC: 5.04 Mil/uL (ref 4.22–5.81)
RDW: 14.6 % (ref 11.5–15.5)
WBC: 9 K/uL (ref 4.0–10.5)

## 2024-04-28 LAB — COMPREHENSIVE METABOLIC PANEL WITH GFR
ALT: 19 U/L (ref 3–53)
AST: 20 U/L (ref 5–37)
Albumin: 4.5 g/dL (ref 3.5–5.2)
Alkaline Phosphatase: 56 U/L (ref 39–117)
BUN: 12 mg/dL (ref 6–23)
CO2: 28 meq/L (ref 19–32)
Calcium: 9.4 mg/dL (ref 8.4–10.5)
Chloride: 104 meq/L (ref 96–112)
Creatinine, Ser: 0.94 mg/dL (ref 0.40–1.50)
GFR: 101.83 mL/min
Glucose, Bld: 88 mg/dL (ref 70–99)
Potassium: 4.3 meq/L (ref 3.5–5.1)
Sodium: 137 meq/L (ref 135–145)
Total Bilirubin: 0.5 mg/dL (ref 0.2–1.2)
Total Protein: 7.5 g/dL (ref 6.0–8.3)

## 2024-04-28 LAB — MICROALBUMIN / CREATININE URINE RATIO
Creatinine,U: 228.7 mg/dL
Microalb Creat Ratio: 7.6 mg/g (ref 0.0–30.0)
Microalb, Ur: 1.7 mg/dL (ref 0.7–1.9)

## 2024-04-28 LAB — HEMOGLOBIN A1C: Hgb A1c MFr Bld: 6.1 % (ref 4.6–6.5)

## 2024-04-28 MED ORDER — LOSARTAN POTASSIUM 100 MG PO TABS: 100.0000 mg | ORAL_TABLET | Freq: Every day | ORAL | 3 refills | Status: AC

## 2024-04-28 NOTE — Progress Notes (Signed)
 "  Assessment & Plan:  Type 2 diabetes mellitus without complication, without long-term current use of insulin (HCC) Assessment & Plan: Chronic, presumed stable.  Pending A1c. Continue Mounjaro  to 10 mg weekly as long as tolerated.  Continue metformin  1000 mg twice daily.    Orders: -     CBC with Differential/Platelet -     Hemoglobin A1c -     Microalbumin / creatinine urine ratio -     Losartan  Potassium; Take 1 tablet (100 mg total) by mouth daily.  Dispense: 90 tablet; Refill: 3 -     Comprehensive metabolic panel with GFR  Essential hypertension Assessment & Plan: Chronic, stable.  Expressed concern that patient has been taking losartan  175 mg dose.  He has been taking the 50, 25 and 100 mg tablet.  Counseled him on importance of only taking 100 mg total daily.  I have refilled losartan  100mg  and sent a note to pharmacy to discontinue the 25 mg and 50 mg tablet.  He verbalized understanding.      Return precautions given.   Risks, benefits, and alternatives of the medications and treatment plan prescribed today were discussed, and patient expressed understanding.   Education regarding symptom management and diagnosis given to patient on AVS either electronically or printed.  Return in about 3 months (around 07/27/2024).  [name], FNP  Subjective:    Patient ID: Jordan Sims, male    DOB: November 19, 1985, 39 y.o.   MRN: [id number]  CC: Jordan Sims is a 39 y.o. male who presents today for follow up.   HPI: Feels well today.  No new complaints.  He is tolerating Mounjaro  10 mg weekly and is satisfied with his weight.  He endorses appetite suppression.  Weight has been stable of late.  He has been taking losartan  100 mg.  He also had losartan  25 mg and losartan  50 mg tablets at home which he had taken as well.     He has run out of losartan  50 mg    Allergies: Patient has no known allergies. Medications Ordered Prior to Encounter[1]  Review of Systems   Constitutional:  Negative for chills and fever.  Respiratory:  Negative for cough.   Cardiovascular:  Negative for chest pain and palpitations.  Gastrointestinal:  Negative for nausea and vomiting.      Objective:    BP 130/78   Pulse 81   Temp 98 F (36.7 C) (Oral)   Ht 6' 5 (1.956 m)   Wt (!) 402 lb 3.2 oz (182.4 kg)   SpO2 97%   BMI 47.69 kg/m  BP Readings from Last 3 Encounters:  04/28/24 130/78  12/17/23 132/80  04/03/23 136/84   Wt Readings from Last 3 Encounters:  04/28/24 (!) 402 lb 3.2 oz (182.4 kg)  12/17/23 (!) 404 lb 1.6 oz (183.3 kg)  04/03/23 (!) 412 lb 12.8 oz (187.2 kg)    Physical Exam Vitals reviewed.  Constitutional:      Appearance: He is well-developed.  Cardiovascular:     Rate and Rhythm: Regular rhythm.     Heart sounds: Normal heart sounds.  Pulmonary:     Effort: Pulmonary effort is normal. No respiratory distress.     Breath sounds: Normal breath sounds. No wheezing, rhonchi or rales.  Skin:    General: Skin is warm and dry.  Neurological:     Mental Status: He is alert.  Psychiatric:        Speech: Speech normal.  Behavior: Behavior normal.           [1]  Current Outpatient Medications on File Prior to Visit  Medication Sig Dispense Refill   metFORMIN  (GLUCOPHAGE -XR) 500 MG 24 hr tablet Take 2 tablets (1,000 mg total) by mouth 2 (two) times daily. 360 tablet 3   rosuvastatin  (CRESTOR ) 10 MG tablet Take 1 tablet (10 mg total) by mouth daily. 90 tablet 3   tirzepatide  (MOUNJARO ) 10 MG/0.5ML Pen Inject 10 mg into the skin once a week. 6 mL 3   No current facility-administered medications on file prior to visit.   "

## 2024-04-28 NOTE — Patient Instructions (Addendum)
 Discontinue losartan  25mg   Discontinue  losartan  50mg  .    The maximum daily dose of losartan  is 100 mg which you are currently taking.  Please do not exceed this dose for your safety.  I have refilled losartan  100 mg daily.  I have discontinued 25 mg and 50 mg losartan  tablet.     Please call me if any questions at all

## 2024-04-28 NOTE — Assessment & Plan Note (Signed)
 Chronic, presumed stable.  Pending A1c. Continue Mounjaro  to 10 mg weekly as long as tolerated.  Continue metformin  1000 mg twice daily.

## 2024-04-28 NOTE — Assessment & Plan Note (Signed)
 Chronic, stable.  Expressed concern that patient has been taking losartan  175 mg dose.  He has been taking the 50, 25 and 100 mg tablet.  Counseled him on importance of only taking 100 mg total daily.  I have refilled losartan  100mg  and sent a note to pharmacy to discontinue the 25 mg and 50 mg tablet.  He verbalized understanding.

## 2024-04-29 ENCOUNTER — Ambulatory Visit: Payer: Self-pay | Admitting: Family

## 2024-07-28 ENCOUNTER — Ambulatory Visit: Admitting: Family
